# Patient Record
Sex: Female | Born: 1990 | Race: Asian | Hispanic: No | Marital: Married | Smoking: Never smoker
Health system: Southern US, Community
[De-identification: ages and names within clinical notes are randomized; demographics above are authoritative.]

## PROBLEM LIST (undated history)

## (undated) DIAGNOSIS — Z789 Other specified health status: Secondary | ICD-10-CM

---

## 2013-12-23 ENCOUNTER — Other Ambulatory Visit (HOSPITAL_COMMUNITY)
Admission: RE | Admit: 2013-12-23 | Discharge: 2013-12-23 | Disposition: A | Discharge status: Home / Self Care | Payer: 59 | Source: Ambulatory Visit | Attending: Family Medicine | Admitting: Family Medicine

## 2013-12-23 DIAGNOSIS — Z113 Encounter for screening for infections with a predominantly sexual mode of transmission: Secondary | ICD-10-CM | POA: Insufficient documentation

## 2013-12-23 DIAGNOSIS — Z124 Encounter for screening for malignant neoplasm of cervix: Secondary | ICD-10-CM | POA: Insufficient documentation

## 2014-11-03 LAB — OB RESULTS CONSOLE RUBELLA ANTIBODY, IGM: RUBELLA: IMMUNE

## 2014-11-03 LAB — OB RESULTS CONSOLE GC/CHLAMYDIA
Chlamydia: NEGATIVE
GC PROBE AMP, GENITAL: NEGATIVE

## 2014-11-03 LAB — OB RESULTS CONSOLE GBS: STREP GROUP B AG: NEGATIVE

## 2014-11-03 LAB — OB RESULTS CONSOLE RPR: RPR: NONREACTIVE

## 2014-11-03 LAB — OB RESULTS CONSOLE HEPATITIS B SURFACE ANTIGEN: Hepatitis B Surface Ag: NEGATIVE

## 2014-11-03 LAB — OB RESULTS CONSOLE HIV ANTIBODY (ROUTINE TESTING): HIV: NONREACTIVE

## 2014-11-03 LAB — OB RESULTS CONSOLE ABO/RH: RH Type: POSITIVE

## 2014-12-03 ENCOUNTER — Inpatient Hospital Stay (HOSPITAL_COMMUNITY): Admission: AD | Admit: 2014-12-03 | Payer: Self-pay | Source: Ambulatory Visit | Admitting: Obstetrics and Gynecology

## 2014-12-29 ENCOUNTER — Other Ambulatory Visit (HOSPITAL_COMMUNITY): Payer: Self-pay | Admitting: Obstetrics and Gynecology

## 2014-12-29 DIAGNOSIS — O280 Abnormal hematological finding on antenatal screening of mother: Secondary | ICD-10-CM

## 2015-01-07 ENCOUNTER — Other Ambulatory Visit (HOSPITAL_COMMUNITY): Payer: Self-pay | Admitting: Obstetrics and Gynecology

## 2015-01-07 ENCOUNTER — Ambulatory Visit (HOSPITAL_COMMUNITY)
Admission: RE | Admit: 2015-01-07 | Discharge: 2015-01-07 | Disposition: A | Discharge status: Home / Self Care | Payer: BLUE CROSS/BLUE SHIELD | Source: Ambulatory Visit | Attending: Obstetrics and Gynecology | Admitting: Obstetrics and Gynecology

## 2015-01-07 ENCOUNTER — Encounter (HOSPITAL_COMMUNITY): Payer: Self-pay

## 2015-01-07 ENCOUNTER — Ambulatory Visit (HOSPITAL_COMMUNITY): Admission: RE | Admit: 2015-01-07 | Payer: BLUE CROSS/BLUE SHIELD | Source: Ambulatory Visit

## 2015-01-07 DIAGNOSIS — O289 Unspecified abnormal findings on antenatal screening of mother: Secondary | ICD-10-CM | POA: Diagnosis not present

## 2015-01-07 DIAGNOSIS — Z3A16 16 weeks gestation of pregnancy: Secondary | ICD-10-CM | POA: Insufficient documentation

## 2015-01-07 DIAGNOSIS — Z3689 Encounter for other specified antenatal screening: Secondary | ICD-10-CM | POA: Insufficient documentation

## 2015-01-07 DIAGNOSIS — O280 Abnormal hematological finding on antenatal screening of mother: Secondary | ICD-10-CM

## 2015-01-07 HISTORY — DX: Other specified health status: Z78.9

## 2015-02-03 ENCOUNTER — Encounter (HOSPITAL_COMMUNITY): Payer: Self-pay

## 2015-02-03 ENCOUNTER — Ambulatory Visit (HOSPITAL_COMMUNITY)
Admission: RE | Admit: 2015-02-03 | Discharge: 2015-02-03 | Disposition: A | Discharge status: Home / Self Care | Payer: BLUE CROSS/BLUE SHIELD | Source: Ambulatory Visit | Attending: Obstetrics and Gynecology | Admitting: Obstetrics and Gynecology

## 2015-02-03 DIAGNOSIS — O280 Abnormal hematological finding on antenatal screening of mother: Secondary | ICD-10-CM

## 2015-02-03 DIAGNOSIS — Z3A20 20 weeks gestation of pregnancy: Secondary | ICD-10-CM | POA: Insufficient documentation

## 2015-02-03 DIAGNOSIS — O4402 Placenta previa specified as without hemorrhage, second trimester: Secondary | ICD-10-CM | POA: Diagnosis not present

## 2015-02-03 DIAGNOSIS — IMO0002 Reserved for concepts with insufficient information to code with codable children: Secondary | ICD-10-CM | POA: Insufficient documentation

## 2015-02-03 DIAGNOSIS — Z36 Encounter for antenatal screening of mother: Secondary | ICD-10-CM | POA: Insufficient documentation

## 2015-02-03 DIAGNOSIS — Z0489 Encounter for examination and observation for other specified reasons: Secondary | ICD-10-CM | POA: Insufficient documentation

## 2015-05-27 LAB — OB RESULTS CONSOLE GBS: GBS: NEGATIVE

## 2015-06-24 ENCOUNTER — Telehealth (HOSPITAL_COMMUNITY): Payer: Self-pay | Admitting: *Deleted

## 2015-06-24 NOTE — Telephone Encounter (Signed)
Preadmission screen  

## 2015-06-27 ENCOUNTER — Encounter (HOSPITAL_COMMUNITY): Payer: Self-pay | Admitting: *Deleted

## 2015-06-29 ENCOUNTER — Inpatient Hospital Stay (HOSPITAL_COMMUNITY)
Admission: AD | Admit: 2015-06-29 | Discharge: 2015-07-03 | DRG: 765 | Disposition: A | Discharge status: Home / Self Care | Payer: BLUE CROSS/BLUE SHIELD | Source: Ambulatory Visit | Attending: Obstetrics and Gynecology | Admitting: Obstetrics and Gynecology

## 2015-06-29 ENCOUNTER — Other Ambulatory Visit: Payer: Self-pay | Admitting: Obstetrics and Gynecology

## 2015-06-29 ENCOUNTER — Encounter (HOSPITAL_COMMUNITY): Payer: Self-pay | Admitting: *Deleted

## 2015-06-29 DIAGNOSIS — O480 Post-term pregnancy: Secondary | ICD-10-CM | POA: Diagnosis present

## 2015-06-29 DIAGNOSIS — O134 Gestational [pregnancy-induced] hypertension without significant proteinuria, complicating childbirth: Secondary | ICD-10-CM | POA: Diagnosis present

## 2015-06-29 DIAGNOSIS — Z3A41 41 weeks gestation of pregnancy: Secondary | ICD-10-CM

## 2015-06-29 DIAGNOSIS — Z880 Allergy status to penicillin: Secondary | ICD-10-CM

## 2015-06-29 DIAGNOSIS — O411230 Chorioamnionitis, third trimester, not applicable or unspecified: Secondary | ICD-10-CM | POA: Diagnosis not present

## 2015-06-29 DIAGNOSIS — O411290 Chorioamnionitis, unspecified trimester, not applicable or unspecified: Secondary | ICD-10-CM | POA: Diagnosis present

## 2015-06-29 LAB — CBC
HCT: 37.7 % (ref 36.0–46.0)
Hemoglobin: 13 g/dL (ref 12.0–15.0)
MCH: 31.2 pg (ref 26.0–34.0)
MCHC: 34.5 g/dL (ref 30.0–36.0)
MCV: 90.4 fL (ref 78.0–100.0)
PLATELETS: 228 10*3/uL (ref 150–400)
RBC: 4.17 MIL/uL (ref 3.87–5.11)
RDW: 13.3 % (ref 11.5–15.5)
WBC: 10.3 10*3/uL (ref 4.0–10.5)

## 2015-06-29 MED ORDER — MISOPROSTOL 25 MCG QUARTER TABLET
25.0000 ug | ORAL_TABLET | ORAL | Status: DC | PRN
  Administered 2015-06-29: 25 ug via VAGINAL
  Filled 2015-06-29: qty 0.25

## 2015-06-29 MED ORDER — TERBUTALINE SULFATE 1 MG/ML IJ SOLN: 0.2500 mg | Freq: Once | INTRAMUSCULAR | Status: DC | PRN

## 2015-06-29 MED ORDER — CITRIC ACID-SODIUM CITRATE 334-500 MG/5ML PO SOLN
30.0000 mL | ORAL | Status: DC | PRN
  Administered 2015-06-30: 30 mL via ORAL
  Filled 2015-06-29: qty 15

## 2015-06-29 MED ORDER — OXYTOCIN 40 UNITS IN LACTATED RINGERS INFUSION - SIMPLE MED: 62.5000 mL/h | INTRAVENOUS | Status: DC

## 2015-06-29 MED ORDER — FENTANYL CITRATE (PF) 100 MCG/2ML IJ SOLN
50.0000 ug | INTRAMUSCULAR | Status: DC | PRN
  Administered 2015-06-30: 50 ug via INTRAVENOUS
  Filled 2015-06-29: qty 2

## 2015-06-29 MED ORDER — LIDOCAINE HCL (PF) 1 % IJ SOLN: 30.0000 mL | INTRAMUSCULAR | Status: DC | PRN

## 2015-06-29 MED ORDER — ACETAMINOPHEN 325 MG PO TABS: 650.0000 mg | ORAL_TABLET | ORAL | Status: DC | PRN

## 2015-06-29 MED ORDER — OXYCODONE-ACETAMINOPHEN 5-325 MG PO TABS: 1.0000 | ORAL_TABLET | ORAL | Status: DC | PRN

## 2015-06-29 MED ORDER — LACTATED RINGERS IV SOLN
500.0000 mL | INTRAVENOUS | Status: DC | PRN
  Administered 2015-06-30 (×2): 1000 mL via INTRAVENOUS
  Administered 2015-06-30 (×3): 500 mL via INTRAVENOUS
  Administered 2015-06-30: 1000 mL via INTRAVENOUS

## 2015-06-29 MED ORDER — OXYTOCIN BOLUS FROM INFUSION: 500.0000 mL | INTRAVENOUS | Status: DC

## 2015-06-29 MED ORDER — ONDANSETRON HCL 4 MG/2ML IJ SOLN
4.0000 mg | Freq: Four times a day (QID) | INTRAMUSCULAR | Status: DC | PRN
Start: 2015-06-29 — End: 2015-06-30

## 2015-06-29 MED ORDER — LACTATED RINGERS IV SOLN
INTRAVENOUS | Status: DC
  Administered 2015-06-29: 125 mL/h via INTRAVENOUS
  Administered 2015-06-30: 09:00:00 via INTRAVENOUS
  Administered 2015-06-30: 125 mL/h via INTRAVENOUS
  Administered 2015-06-30: 13:00:00 via INTRAVENOUS

## 2015-06-29 MED ORDER — OXYCODONE-ACETAMINOPHEN 5-325 MG PO TABS: 2.0000 | ORAL_TABLET | ORAL | Status: DC | PRN

## 2015-06-29 NOTE — MAU Note (Signed)
Pt states contractions started around 1900 and now are every 3-4 minutes.  Pt has induction scheduled for midnight but was having too much pain to wait.  Good fetal movement.  Denies vaginal bleeding but some pinkish discharge.  No ROM.

## 2015-06-29 NOTE — H&P (Signed)
Jennifer Brooks is a 24 y.o. female  G2 P0010 at 41 wks and 0 days based on 16 wk ultrasound with EDD 06/23/2015 presents for induction. Her pregnancy was complicated by abnormal tetra screen ( 1 out 10 risk of down syndrome) Panorama test was negative with low risk result less than 1 out 10,0000 risk of down syndrome.    History OB History    Gravida Para Term Preterm AB TAB SAB Ectopic Multiple Living   2    1 1     0     Past Medical History  Diagnosis Date  . Medical history non-contributory    Past Surgical History  Procedure Laterality Date  . No past surgeries     Family History: family history includes Hypertension in her maternal grandfather, maternal grandmother, and mother; Miscarriages / [place] in her sister. There is no history of Alcohol abuse, Arthritis, Asthma, Birth defects, Cancer, COPD, Depression, Diabetes, Drug abuse, Early death, Hearing loss, Heart disease, Hyperlipidemia, Kidney disease, Learning disabilities, Mental illness, Mental retardation, Stroke, Vision loss, or Varicose Veins. Social History:  reports that she has never smoked. She has never used smokeless tobacco. She reports that she does not drink alcohol or use illicit drugs.   Prenatal Transfer Tool  Maternal Diabetes: No Genetic Screening: Abnormal:  Results: Other: Tetra was abnormal for elevated risk of trisomy 1 out 10.Marland Kitchen. Panorama result was low risk less than 1 out of [id number] Maternal Ultrasounds/Referrals: Normal Fetal Ultrasounds or other Referrals:  None Maternal Substance Abuse:  No Significant Maternal Medications:  None Significant Maternal Lab Results:  Lab values include: Group B Strep negative Other Comments:  None  Review of Systems  Constitutional: Negative.   HENT: Negative.   Eyes: Negative.   Respiratory: Negative.   Cardiovascular: Negative.   Gastrointestinal: Negative.   Genitourinary: Negative.   Musculoskeletal: Negative.   Skin: Negative.   Neurological: Negative.    Endo/Heme/Allergies: Negative.   Psychiatric/Behavioral: Negative.       Last menstrual period 08/31/2014. Maternal Exam:  Abdomen: Patient reports no abdominal tenderness. Estimated fetal weight is 8 lbs .   Fetal presentation: vertex  Introitus: Normal vulva. Normal vagina.  Pelvis: adequate for delivery.   Cervix: Cervix evaluated by digital exam.     Physical Exam  Vitals reviewed. Constitutional: She is oriented to person, place, and time. She appears well-developed and well-nourished.  HENT:  Head: Normocephalic and atraumatic.  Eyes: Conjunctivae are normal. Pupils are equal, round, and reactive to light.  Neck: Normal range of motion. Neck supple.  Cardiovascular: Normal rate and regular rhythm.   Respiratory: Effort normal and breath sounds normal.  GI: Bowel sounds are normal. There is no tenderness.  Genitourinary: Vagina normal.  Musculoskeletal: Normal range of motion.  Neurological: She is alert and oriented to person, place, and time. She has normal reflexes.  Skin: Skin is warm and dry.  Psychiatric: She has a normal mood and affect.   Cervix Closed Soft mid position    Prenatal labs: ABO, Rh: B/Positive/-- (03/23 0000) Antibody:   Rubella: Immune (03/23 0000) RPR: Nonreactive (03/23 0000)  HBsAg:   Negative  HIV: Non-reactive (03/23 0000)  GBS: Negative (10/14 0000)   Assessment/Plan: 41 wks and 0 days for post dates induction.  She is informed of increased r/o cesarean section associated with induction and accepts this risk Plan cytotec for cervical ripening.  Anticipate SVD Dr. Myna HidalgoJennifer Ozan covering from midnight to 7 am on 06/30/2015  Lorraine Terriquez J. 06/29/2015, 5:56 PM

## 2015-06-30 ENCOUNTER — Inpatient Hospital Stay (HOSPITAL_COMMUNITY): Payer: BLUE CROSS/BLUE SHIELD | Admitting: Anesthesiology

## 2015-06-30 ENCOUNTER — Inpatient Hospital Stay (HOSPITAL_COMMUNITY): Admission: RE | Admit: 2015-06-30 | Payer: BLUE CROSS/BLUE SHIELD | Source: Ambulatory Visit

## 2015-06-30 ENCOUNTER — Encounter (HOSPITAL_COMMUNITY)
Admission: AD | Disposition: A | Discharge status: Home / Self Care | Payer: Self-pay | Source: Ambulatory Visit | Attending: Obstetrics and Gynecology

## 2015-06-30 ENCOUNTER — Encounter (HOSPITAL_COMMUNITY): Payer: Self-pay | Admitting: Anesthesiology

## 2015-06-30 DIAGNOSIS — O411290 Chorioamnionitis, unspecified trimester, not applicable or unspecified: Secondary | ICD-10-CM | POA: Diagnosis present

## 2015-06-30 LAB — PROTEIN / CREATININE RATIO, URINE
Creatinine, Urine: 99 mg/dL
PROTEIN CREATININE RATIO: 0.09 mg/mg{creat} (ref 0.00–0.15)
Total Protein, Urine: 9 mg/dL

## 2015-06-30 LAB — COMPREHENSIVE METABOLIC PANEL
ALBUMIN: 3.3 g/dL — AB (ref 3.5–5.0)
ALT: 32 U/L (ref 14–54)
ANION GAP: 9 (ref 5–15)
AST: 32 U/L (ref 15–41)
Alkaline Phosphatase: 271 U/L — ABNORMAL HIGH (ref 38–126)
BILIRUBIN TOTAL: 0.4 mg/dL (ref 0.3–1.2)
BUN: 12 mg/dL (ref 6–20)
CHLORIDE: 105 mmol/L (ref 101–111)
CO2: 21 mmol/L — AB (ref 22–32)
Calcium: 9.4 mg/dL (ref 8.9–10.3)
Creatinine, Ser: 0.55 mg/dL (ref 0.44–1.00)
GFR calc Af Amer: >60 mL/min (ref >60)
GFR calc non Af Amer: >60 mL/min (ref >60)
GLUCOSE: 99 mg/dL (ref 65–99)
POTASSIUM: 3.8 mmol/L (ref 3.5–5.1)
SODIUM: 135 mmol/L (ref 135–145)
Total Protein: 6.4 g/dL — ABNORMAL LOW (ref 6.5–8.1)

## 2015-06-30 LAB — RPR: RPR: NONREACTIVE

## 2015-06-30 LAB — TYPE AND SCREEN
ABO/RH(D): B POS
Antibody Screen: NEGATIVE

## 2015-06-30 LAB — URIC ACID: Uric Acid, Serum: 6.6 mg/dL (ref 2.3–6.6)

## 2015-06-30 LAB — ABO/RH: ABO/RH(D): B POS

## 2015-06-30 SURGERY — Surgical Case
Anesthesia: Epidural

## 2015-06-30 MED ORDER — VANCOMYCIN HCL IN DEXTROSE 1-5 GM/200ML-% IV SOLN: 1000.0000 mg | Freq: Two times a day (BID) | INTRAVENOUS | Status: DC

## 2015-06-30 MED ORDER — CLINDAMYCIN PHOSPHATE 900 MG/50ML IV SOLN: 900.0000 mg | Freq: Three times a day (TID) | INTRAVENOUS | Status: DC

## 2015-06-30 MED ORDER — ZOLPIDEM TARTRATE 5 MG PO TABS: 5.0000 mg | ORAL_TABLET | Freq: Every evening | ORAL | Status: DC | PRN

## 2015-06-30 MED ORDER — OXYTOCIN 40 UNITS IN LACTATED RINGERS INFUSION - SIMPLE MED
1.0000 m[IU]/min | INTRAVENOUS | Status: DC
  Administered 2015-06-30: 3 m[IU]/min via INTRAVENOUS
  Administered 2015-06-30: 1 m[IU]/min via INTRAVENOUS
  Filled 2015-06-30: qty 1000

## 2015-06-30 MED ORDER — PHENYLEPHRINE 40 MCG/ML (10ML) SYRINGE FOR IV PUSH (FOR BLOOD PRESSURE SUPPORT)
PREFILLED_SYRINGE | INTRAVENOUS | Status: AC
  Filled 2015-06-30: qty 10

## 2015-06-30 MED ORDER — ONDANSETRON HCL 4 MG/2ML IJ SOLN: 4.0000 mg | Freq: Three times a day (TID) | INTRAMUSCULAR | Status: DC | PRN

## 2015-06-30 MED ORDER — SODIUM BICARBONATE 8.4 % IV SOLN
INTRAVENOUS | Status: DC | PRN
  Administered 2015-06-30: 5 mL via EPIDURAL
  Administered 2015-06-30: 2 mL via EPIDURAL
  Administered 2015-06-30: 3 mL via EPIDURAL
  Administered 2015-06-30: 5 mL via EPIDURAL

## 2015-06-30 MED ORDER — DIPHENHYDRAMINE HCL 50 MG/ML IJ SOLN: 12.5000 mg | INTRAMUSCULAR | Status: DC | PRN

## 2015-06-30 MED ORDER — PHENYLEPHRINE HCL 10 MG/ML IJ SOLN
INTRAMUSCULAR | Status: DC | PRN
  Administered 2015-06-30: 40 ug via INTRAVENOUS

## 2015-06-30 MED ORDER — KETOROLAC TROMETHAMINE 30 MG/ML IJ SOLN: 30.0000 mg | Freq: Four times a day (QID) | INTRAMUSCULAR | Status: AC | PRN

## 2015-06-30 MED ORDER — FENTANYL 2.5 MCG/ML BUPIVACAINE 1/10 % EPIDURAL INFUSION (WH - ANES)
14.0000 mL/h | INTRAMUSCULAR | Status: DC | PRN
  Administered 2015-06-30 (×3): 14 mL/h via EPIDURAL
  Filled 2015-06-30 (×2): qty 125

## 2015-06-30 MED ORDER — MORPHINE SULFATE (PF) 0.5 MG/ML IJ SOLN
INTRAMUSCULAR | Status: DC | PRN
  Administered 2015-06-30: 1 mg via INTRAVENOUS
  Administered 2015-06-30: 4 mg via EPIDURAL

## 2015-06-30 MED ORDER — NALBUPHINE HCL 10 MG/ML IJ SOLN: 5.0000 mg | INTRAMUSCULAR | Status: DC | PRN

## 2015-06-30 MED ORDER — OXYCODONE-ACETAMINOPHEN 5-325 MG PO TABS
1.0000 | ORAL_TABLET | ORAL | Status: DC | PRN
  Administered 2015-07-01 – 2015-07-02 (×4): 1 via ORAL
  Filled 2015-06-30 (×4): qty 1

## 2015-06-30 MED ORDER — LACTATED RINGERS IV SOLN
INTRAVENOUS | Status: DC
  Administered 2015-06-30 – 2015-07-01 (×3): via INTRAVENOUS

## 2015-06-30 MED ORDER — LACTATED RINGERS IV SOLN
INTRAVENOUS | Status: DC | PRN
  Administered 2015-06-30 (×2): via INTRAVENOUS

## 2015-06-30 MED ORDER — PRENATAL MULTIVITAMIN CH
1.0000 | ORAL_TABLET | Freq: Every day | ORAL | Status: DC
  Administered 2015-07-01 – 2015-07-03 (×3): 1 via ORAL
  Filled 2015-06-30 (×3): qty 1

## 2015-06-30 MED ORDER — ONDANSETRON HCL 4 MG/2ML IJ SOLN: 4.0000 mg | Freq: Once | INTRAMUSCULAR | Status: DC | PRN

## 2015-06-30 MED ORDER — OXYCODONE-ACETAMINOPHEN 5-325 MG PO TABS
2.0000 | ORAL_TABLET | ORAL | Status: DC | PRN
  Administered 2015-07-02 – 2015-07-03 (×4): 2 via ORAL
  Filled 2015-06-30 (×4): qty 2

## 2015-06-30 MED ORDER — SENNOSIDES-DOCUSATE SODIUM 8.6-50 MG PO TABS
2.0000 | ORAL_TABLET | ORAL | Status: DC
  Administered 2015-06-30 – 2015-07-02 (×2): 2 via ORAL
  Filled 2015-06-30 (×2): qty 2

## 2015-06-30 MED ORDER — FENTANYL CITRATE (PF) 100 MCG/2ML IJ SOLN: 25.0000 ug | INTRAMUSCULAR | Status: DC | PRN

## 2015-06-30 MED ORDER — ONDANSETRON HCL 4 MG/2ML IJ SOLN
INTRAMUSCULAR | Status: AC
  Filled 2015-06-30: qty 2

## 2015-06-30 MED ORDER — EPHEDRINE 5 MG/ML INJ: 10.0000 mg | INTRAVENOUS | Status: DC | PRN

## 2015-06-30 MED ORDER — OXYTOCIN 10 UNIT/ML IJ SOLN
40.0000 [IU] | INTRAVENOUS | Status: DC | PRN
  Administered 2015-06-30: 40 [IU] via INTRAVENOUS

## 2015-06-30 MED ORDER — MORPHINE SULFATE (PF) 0.5 MG/ML IJ SOLN
INTRAMUSCULAR | Status: AC
  Filled 2015-06-30: qty 10

## 2015-06-30 MED ORDER — NALBUPHINE HCL 10 MG/ML IJ SOLN: 5.0000 mg | Freq: Once | INTRAMUSCULAR | Status: DC | PRN

## 2015-06-30 MED ORDER — WITCH HAZEL-GLYCERIN EX PADS: 1.0000 "application " | MEDICATED_PAD | CUTANEOUS | Status: DC | PRN

## 2015-06-30 MED ORDER — LANOLIN HYDROUS EX OINT: 1.0000 "application " | TOPICAL_OINTMENT | CUTANEOUS | Status: DC | PRN

## 2015-06-30 MED ORDER — ACETAMINOPHEN 325 MG PO TABS: 650.0000 mg | ORAL_TABLET | ORAL | Status: DC | PRN

## 2015-06-30 MED ORDER — SCOPOLAMINE 1 MG/3DAYS TD PT72
MEDICATED_PATCH | TRANSDERMAL | Status: AC
  Filled 2015-06-30: qty 1

## 2015-06-30 MED ORDER — SCOPOLAMINE 1 MG/3DAYS TD PT72
1.0000 | MEDICATED_PATCH | Freq: Once | TRANSDERMAL | Status: DC
  Filled 2015-06-30: qty 1

## 2015-06-30 MED ORDER — SIMETHICONE 80 MG PO CHEW
80.0000 mg | CHEWABLE_TABLET | Freq: Three times a day (TID) | ORAL | Status: DC
  Administered 2015-07-01 – 2015-07-03 (×8): 80 mg via ORAL
  Filled 2015-06-30 (×8): qty 1

## 2015-06-30 MED ORDER — ACETAMINOPHEN 500 MG PO TABS
1000.0000 mg | ORAL_TABLET | Freq: Four times a day (QID) | ORAL | Status: AC
  Administered 2015-06-30 – 2015-07-01 (×3): 1000 mg via ORAL
  Filled 2015-06-30 (×3): qty 2

## 2015-06-30 MED ORDER — GENTAMICIN SULFATE 40 MG/ML IJ SOLN
Freq: Three times a day (TID) | INTRAVENOUS | Status: DC
  Administered 2015-06-30: 17:00:00 via INTRAVENOUS
  Filled 2015-06-30 (×2): qty 4

## 2015-06-30 MED ORDER — DIPHENHYDRAMINE HCL 25 MG PO CAPS: 25.0000 mg | ORAL_CAPSULE | Freq: Four times a day (QID) | ORAL | Status: DC | PRN

## 2015-06-30 MED ORDER — TERBUTALINE SULFATE 1 MG/ML IJ SOLN: 0.2500 mg | Freq: Once | INTRAMUSCULAR | Status: DC | PRN

## 2015-06-30 MED ORDER — MEPERIDINE HCL 25 MG/ML IJ SOLN: 6.2500 mg | INTRAMUSCULAR | Status: DC | PRN

## 2015-06-30 MED ORDER — ERYTHROMYCIN 5 MG/GM OP OINT
TOPICAL_OINTMENT | OPHTHALMIC | Status: AC
  Filled 2015-06-30: qty 1

## 2015-06-30 MED ORDER — LACTATED RINGERS IV SOLN: INTRAVENOUS | Status: DC

## 2015-06-30 MED ORDER — NALOXONE HCL 0.4 MG/ML IJ SOLN: 0.4000 mg | INTRAMUSCULAR | Status: DC | PRN

## 2015-06-30 MED ORDER — DIBUCAINE 1 % RE OINT: 1.0000 "application " | TOPICAL_OINTMENT | RECTAL | Status: DC | PRN

## 2015-06-30 MED ORDER — LIDOCAINE HCL (PF) 1 % IJ SOLN
INTRAMUSCULAR | Status: DC | PRN
  Administered 2015-06-30 (×2): 8 mL via EPIDURAL

## 2015-06-30 MED ORDER — OXYTOCIN 10 UNIT/ML IJ SOLN
INTRAMUSCULAR | Status: AC
Start: 2015-06-30 — End: 2015-06-30
  Filled 2015-06-30: qty 4

## 2015-06-30 MED ORDER — SODIUM CHLORIDE 0.9 % IR SOLN
Status: DC | PRN
  Administered 2015-06-30: 1000 mL

## 2015-06-30 MED ORDER — ACETAMINOPHEN 500 MG PO TABS
1000.0000 mg | ORAL_TABLET | Freq: Four times a day (QID) | ORAL | Status: DC | PRN
  Administered 2015-06-30: 1000 mg via ORAL
  Filled 2015-06-30: qty 2

## 2015-06-30 MED ORDER — LACTATED RINGERS IV SOLN
INTRAVENOUS | Status: DC | PRN
  Administered 2015-06-30: 19:00:00 via INTRAVENOUS

## 2015-06-30 MED ORDER — IBUPROFEN 600 MG PO TABS
600.0000 mg | ORAL_TABLET | Freq: Four times a day (QID) | ORAL | Status: DC
  Administered 2015-07-01 – 2015-07-03 (×10): 600 mg via ORAL
  Filled 2015-06-30 (×10): qty 1

## 2015-06-30 MED ORDER — NALOXONE HCL 2 MG/2ML IJ SOSY: 1.0000 ug/kg/h | PREFILLED_SYRINGE | INTRAMUSCULAR | Status: DC | PRN

## 2015-06-30 MED ORDER — PHENYLEPHRINE 40 MCG/ML (10ML) SYRINGE FOR IV PUSH (FOR BLOOD PRESSURE SUPPORT)
80.0000 ug | PREFILLED_SYRINGE | INTRAVENOUS | Status: DC | PRN
  Filled 2015-06-30: qty 20

## 2015-06-30 MED ORDER — MENTHOL 3 MG MT LOZG: 1.0000 | LOZENGE | OROMUCOSAL | Status: DC | PRN

## 2015-06-30 MED ORDER — SIMETHICONE 80 MG PO CHEW: 80.0000 mg | CHEWABLE_TABLET | ORAL | Status: DC | PRN

## 2015-06-30 MED ORDER — DIPHENHYDRAMINE HCL 25 MG PO CAPS: 25.0000 mg | ORAL_CAPSULE | ORAL | Status: DC | PRN

## 2015-06-30 MED ORDER — OXYTOCIN 40 UNITS IN LACTATED RINGERS INFUSION - SIMPLE MED: 62.5000 mL/h | INTRAVENOUS | Status: AC

## 2015-06-30 MED ORDER — SIMETHICONE 80 MG PO CHEW
80.0000 mg | CHEWABLE_TABLET | ORAL | Status: DC
  Administered 2015-06-30 – 2015-07-02 (×2): 80 mg via ORAL
  Filled 2015-06-30 (×2): qty 1

## 2015-06-30 MED ORDER — FERROUS SULFATE 325 (65 FE) MG PO TABS
325.0000 mg | ORAL_TABLET | Freq: Two times a day (BID) | ORAL | Status: DC
Start: 2015-07-01 — End: 2015-07-03
  Administered 2015-07-01 – 2015-07-03 (×5): 325 mg via ORAL
  Filled 2015-06-30 (×5): qty 1

## 2015-06-30 MED ORDER — KETOROLAC TROMETHAMINE 30 MG/ML IJ SOLN
30.0000 mg | Freq: Four times a day (QID) | INTRAMUSCULAR | Status: AC | PRN
  Administered 2015-06-30: 30 mg via INTRAMUSCULAR

## 2015-06-30 MED ORDER — SCOPOLAMINE 1 MG/3DAYS TD PT72
MEDICATED_PATCH | TRANSDERMAL | Status: DC | PRN
  Administered 2015-06-30: 1 via TRANSDERMAL

## 2015-06-30 MED ORDER — SODIUM CHLORIDE 0.9 % IJ SOLN: 3.0000 mL | INTRAMUSCULAR | Status: DC | PRN

## 2015-06-30 MED ORDER — KETOROLAC TROMETHAMINE 30 MG/ML IJ SOLN
INTRAMUSCULAR | Status: AC
  Administered 2015-06-30: 30 mg via INTRAMUSCULAR
  Filled 2015-06-30: qty 1

## 2015-06-30 MED ORDER — ONDANSETRON HCL 4 MG/2ML IJ SOLN
INTRAMUSCULAR | Status: DC | PRN
  Administered 2015-06-30: 4 mg via INTRAVENOUS

## 2015-06-30 SURGICAL SUPPLY — 40 items
BARRIER ADHS 3X4 INTERCEED (GAUZE/BANDAGES/DRESSINGS) ×2 IMPLANT
BENZOIN TINCTURE PRP APPL 2/3 (GAUZE/BANDAGES/DRESSINGS) ×2 IMPLANT
CLAMP CORD UMBIL (MISCELLANEOUS) IMPLANT
CLOTH BEACON ORANGE TIMEOUT ST (SAFETY) ×2 IMPLANT
CONTAINER PREFILL 10% NBF 15ML (MISCELLANEOUS) IMPLANT
DRAPE SHEET LG 3/4 BI-LAMINATE (DRAPES) IMPLANT
DRSG OPSITE POSTOP 4X10 (GAUZE/BANDAGES/DRESSINGS) ×2 IMPLANT
DURAPREP 26ML APPLICATOR (WOUND CARE) ×2 IMPLANT
ELECT REM PT RETURN 9FT ADLT (ELECTROSURGICAL) ×2
ELECTRODE REM PT RTRN 9FT ADLT (ELECTROSURGICAL) ×1 IMPLANT
EXTRACTOR VACUUM M CUP 4 TUBE (SUCTIONS) IMPLANT
GLOVE BIOGEL M 6.5 STRL (GLOVE) ×4 IMPLANT
GLOVE BIOGEL PI IND STRL 6.5 (GLOVE) ×1 IMPLANT
GLOVE BIOGEL PI IND STRL 7.0 (GLOVE) ×1 IMPLANT
GLOVE BIOGEL PI INDICATOR 6.5 (GLOVE) ×1
GLOVE BIOGEL PI INDICATOR 7.0 (GLOVE) ×1
GOWN STRL REUS W/TWL LRG LVL3 (GOWN DISPOSABLE) ×6 IMPLANT
KIT ABG SYR 3ML LUER SLIP (SYRINGE) IMPLANT
NEEDLE HYPO 25X5/8 SAFETYGLIDE (NEEDLE) IMPLANT
NS IRRIG 1000ML POUR BTL (IV SOLUTION) ×2 IMPLANT
PACK C SECTION WH (CUSTOM PROCEDURE TRAY) ×2 IMPLANT
PAD OB MATERNITY 4.3X12.25 (PERSONAL CARE ITEMS) ×2 IMPLANT
PENCIL SMOKE EVAC W/HOLSTER (ELECTROSURGICAL) ×2 IMPLANT
RTRCTR C-SECT PINK 25CM LRG (MISCELLANEOUS) IMPLANT
RTRCTR C-SECT PINK 34CM XLRG (MISCELLANEOUS) IMPLANT
STRIP CLOSURE SKIN 1/2X4 (GAUZE/BANDAGES/DRESSINGS) ×2 IMPLANT
SUT PDS AB 0 CT1 27 (SUTURE) ×4 IMPLANT
SUT PLAIN 0 NONE (SUTURE) IMPLANT
SUT VIC AB 0 CT2 27 (SUTURE) ×2 IMPLANT
SUT VIC AB 0 CTX 36 (SUTURE) ×4
SUT VIC AB 0 CTX36XBRD ANBCTRL (SUTURE) ×4 IMPLANT
SUT VIC AB 2-0 CT1 27 (SUTURE) ×2
SUT VIC AB 2-0 CT1 TAPERPNT 27 (SUTURE) ×2 IMPLANT
SUT VIC AB 2-0 SH 27 (SUTURE) ×1
SUT VIC AB 2-0 SH 27XBRD (SUTURE) ×1 IMPLANT
SUT VIC AB 3-0 SH 27 (SUTURE)
SUT VIC AB 3-0 SH 27X BRD (SUTURE) IMPLANT
SUT VIC AB 4-0 KS 27 (SUTURE) ×2 IMPLANT
TOWEL OR 17X24 6PK STRL BLUE (TOWEL DISPOSABLE) ×2 IMPLANT
TRAY FOLEY CATH SILVER 14FR (SET/KITS/TRAYS/PACK) ×2 IMPLANT

## 2015-06-30 NOTE — Lactation Note (Signed)
This note was copied from the chart of Jennifer Skeet [name]. Lactation Consultation Note  Patient Name: Jennifer Brooks ZOXWR'UToday's Date: 06/30/2015 Reason for consult: Initial assessment Baby at 3 hr of life and mom reports that he has not attempt to eat yet. She stated that he has been sleeping since he was born. Discussed feeding frequency, waking a sleeping baby, voids, wt loss, breast changes, nipple care, and baby behavior. Given lactation handouts. She is aware of OP services. She will eat her dinner then try to latch baby again.    Maternal Data Has patient been taught Hand Expression?: Yes Does the patient have breastfeeding experience prior to this delivery?: No  Feeding Feeding Type:  (mom denies the baby attempting to latch )  LATCH Score/Interventions                      Lactation Tools Discussed/Used WIC Program: No   Consult Status Consult Status: Follow-up Date: 07/01/15 Follow-up type: In-patient    Jennifer Brooks 06/30/2015, 9:50 PM

## 2015-06-30 NOTE — Anesthesia Procedure Notes (Signed)
Epidural Patient location during procedure: OB Start time: 06/30/2015 4:09 AM End time: 06/30/2015 4:13 AM  Staffing Anesthesiologist: Leilani AbleHATCHETT, Romesha Scherer Performed by: anesthesiologist   Preanesthetic Checklist Completed: patient identified, surgical consent, pre-op evaluation, timeout performed, IV checked, risks and benefits discussed and monitors and equipment checked  Epidural Patient position: sitting Prep: site prepped and draped and DuraPrep Patient monitoring: continuous pulse ox and blood pressure Approach: midline Location: L3-L4 Injection technique: LOR air  Needle:  Needle type: Tuohy  Needle gauge: 17 G Needle length: 9 cm and 9 Needle insertion depth: 4 cm Catheter type: closed end flexible Catheter size: 19 Gauge Catheter at skin depth: 9 cm Test dose: negative and Other  Assessment Sensory level: T9 Events: blood not aspirated, injection not painful, no injection resistance, negative IV test and no paresthesia  Additional Notes Reason for block:procedure for pain

## 2015-06-30 NOTE — Progress Notes (Signed)
OB PN:  S: Pt resting comfortably, reports moderate contractions.  S/p IV Stadol x1.  No headache, no blurry vision  O: BP 128/78 mmHg  Pulse 91  Temp(Src) 98.6 F (37 C) (Oral)  Resp 16  Ht 5\' 3"  (1.6 m)  Wt 73.936 kg (163 lb)  BMI 28.88 kg/m2  LMP 08/31/2014  BP range: 117-152/69-93 FHT: 140bpm, moderate variablity,  accels, variable decels x3 to 90bpm x 30-50sec with spontaneous recovery Toco: q2-646min SVE: 1-2/25/-3 SSE: Cook balloon placed without difficulty, 50cc infused Ext: no edema, no calf tenderness bilaterally  Results for orders placed or performed during the hospital encounter of 06/29/15 (from the past 24 hour(s))  CBC     Status: None   Collection Time: 06/29/15 11:10 PM  Result Value Ref Range   WBC 10.3 4.0 - 10.5 K/uL   RBC 4.17 3.87 - 5.11 MIL/uL   Hemoglobin 13.0 12.0 - 15.0 g/dL   HCT 40.937.7 81.136.0 - 91.446.0 %   MCV 90.4 78.0 - 100.0 fL   MCH 31.2 26.0 - 34.0 pg   MCHC 34.5 30.0 - 36.0 g/dL   RDW 78.213.3 95.611.5 - 21.315.5 %   Platelets 228 150 - 400 K/uL  Type and screen     Status: None   Collection Time: 06/29/15 11:10 PM  Result Value Ref Range   ABO/RH(D) B POS    Antibody Screen NEG    Sample Expiration 07/02/2015   Uric acid     Status: None   Collection Time: 06/29/15 11:10 PM  Result Value Ref Range   Uric Acid, Serum 6.6 2.3 - 6.6 mg/dL  Comprehensive metabolic panel     Status: Abnormal   Collection Time: 06/29/15 11:10 PM  Result Value Ref Range   Sodium 135 135 - 145 mmol/L   Potassium 3.8 3.5 - 5.1 mmol/L   Chloride 105 101 - 111 mmol/L   CO2 21 (L) 22 - 32 mmol/L   Glucose, Bld 99 65 - 99 mg/dL   BUN 12 6 - 20 mg/dL   Creatinine, Ser 0.860.55 0.44 - 1.00 mg/dL   Calcium 9.4 8.9 - 57.810.3 mg/dL   Total Protein 6.4 (L) 6.5 - 8.1 g/dL   Albumin 3.3 (L) 3.5 - 5.0 g/dL   AST 32 15 - 41 U/L   ALT 32 14 - 54 U/L   Alkaline Phosphatase 271 (H) 38 - 126 U/L   Total Bilirubin 0.4 0.3 - 1.2 mg/dL   GFR calc non Af Amer >60 >60 mL/min   GFR calc Af Amer  >60 >60 mL/min   Anion gap 9 5 - 15    PC ratio pending  A/P: 24 y.o. G1P0 @ 7472w0d who presented for IOL due to postterm pregnancy 1. FWB: Cat. II- pt repositioned, overall FHT reassuring 2. Labor: s/p cytotec x 1, Cook balloon placed will start Pit per protocol Pain: IV pain medication as needed GBS: negative 3. Elevated BP-  Will rule out preeclampsia, PC ratio pending  Myna HidalgoJennifer Laurelyn Terrero, DO [phone] (pager) [phone] (office)

## 2015-06-30 NOTE — Op Note (Signed)
Cesarean Section Procedure Note  Indications: non-reassuring fetal status  Pre-operative Diagnosis: 41 week 0 day pregnancy.2. Chorioamnionitis   Post-operative Diagnosis: same  Surgeon: Jessee AversOLE,Kathleen Tamm J.   Assistants: None  Anesthesia: epidurl  anesthesia  ASA Class: 2   Procedure Details   The patient was seen in the Holding Room. The risks, benefits, complications, treatment options, and expected outcomes were discussed with the patient.  The patient concurred with the proposed plan, giving informed consent.  The site of surgery properly noted/marked. The patient was taken to Operating Room # 9, identified as Jennifer Brooks and the procedure verified as C-Section Delivery. A Time Out was held and the above information confirmed.  After induction of anesthesia, the patient was draped and prepped in the usual sterile manner. A Pfannenstiel incision was made and carried down through the subcutaneous tissue to the fascia. Fascial incision was made and extended transversely. The fascia was separated from the underlying rectus tissue superiorly and inferiorly. The peritoneum was identified and entered. Peritoneal incision was extended longitudinally. The utero-vesical peritoneal reflection was incised transversely and the bladder flap was bluntly freed from the lower uterine segment. A low transverse uterine incision was made. Delivered from cephalic presentation was a Female with Apgar scores of 9 at one minute and 10 at five minutes. After the umbilical cord was clamped and cut cord blood was obtained for evaluation. The placenta was removed intact and appeared normal. The uterine outline, tubes and ovaries appeared normal. The uterine incision was closed with running locked sutures of 0 vicryl . A second layer of the same suture was used to imbricate the incision . Hemostasis was observed. Lavage was carried out until clear. The fascia was then reapproximated with running sutures of 0 pds. The skin was  reapproximated with 4-0 vicryl .  Instrument, sponge, and needle counts were correct prior the abdominal closure and at the conclusion of the case.   Findings:  Female infant in the cephalic presentation with nuchal cord x2. Thick meconium . Normal fallopian tubes and ovaries   Estimated Blood Loss:  800 mL         Drains: Foley          Total IV Fluids:   2100 ml         Specimens: Placenta and Disposition:  Sent to Pathology          Implants: None         Complications:  None; patient tolerated the procedure well.         Disposition: PACU - hemodynamically stable.         Condition: stable  Attending Attestation: I performed the procedure.

## 2015-06-30 NOTE — Progress Notes (Signed)
Jennifer Brooks is a 24 y.o. G2P0010 at 638w0d by ultrasound admitted for induction of labor due to Post dates. Due date 06/23/2015.  Subjective:  Patient is comfortable with her epidural. She denies ha/ visual disturbances or ruq pain   Objective: BP 141/77 mmHg  Pulse 86  Temp(Src) 99.3 F (37.4 C) (Oral)  Resp 16  Ht 5\' 3"  (1.6 m)  Wt 73.936 kg (163 lb)  BMI 28.88 kg/m2  SpO2 100%  LMP 08/31/2014 I/O last 3 completed shifts: In: -  Out: 650 [Urine:650]    FHT:  FHR: 150 bpm, variability: moderate,  accelerations:  Present,  decelerations:  Present  occasional variable deceleration  UC:   regular, every 1-5 minutes SVE:   Dilation: 5 Effacement (%): 70 Station: -2 Exam by:: MD Richardson Doppole  Labs: Lab Results  Component Value Date   WBC 10.3 06/29/2015   HGB 13.0 06/29/2015   HCT 37.7 06/29/2015   MCV 90.4 06/29/2015   PLT 228 06/29/2015    Assessment / Plan: Induction of labor due to postterm,  progressing well on pitocin  Labor: Progressing on Pitocin, will continue to increase then AROM Preeclampsia:  labs stable and gestational hypertension  Fetal Wellbeing:  Category I Pain Control:  Epidural I/D:  n/a Anticipated MOD:  NSVD  Jennifer Brooks J. 06/30/2015, 7:35 AM

## 2015-06-30 NOTE — Progress Notes (Signed)
In to reassess patient.. Cervix still 8 cm /80/0 station. Head feels asynclitic  FHR 180's with minimal variability. No acelerations. Variable decelerations.  Continued category 2 tracing.  Recommend cesarean section given nonreassuring fetal heart rate .... Risks reviewed. Patient is in agreement to proceed with cesarean section

## 2015-06-30 NOTE — Progress Notes (Signed)
Jennifer Brooks is a 24 y.o. G2P0010 at 5842w0d by ultrasound admitted for induction of labor due to Post dates. Due date 06/23/2015.  Subjective: Patient is comfortable with her epidural.. Into assess patient due to report of late decelerations.  Objective: BP 126/73 mmHg  Pulse 98  Temp(Src) 101.3 F (38.5 C) (Axillary)  Resp 18  Ht 5\' 3"  (1.6 m)  Wt 73.936 kg (163 lb)  BMI 28.88 kg/m2  SpO2 100%  LMP 08/31/2014 I/O last 3 completed shifts: In: -  Out: 650 [Urine:650] Total I/O In: -  Out: 825 [Urine:825]  FHT:  FHR: 170 bpm, variability: minimal ,  accelerations:  Present,  decelerations:  Present early decelerations UC:   regular, every 2 minutes SVE:   8/80/0 station  Labs: Lab Results  Component Value Date   WBC 10.3 06/29/2015   HGB 13.0 06/29/2015   HCT 37.7 06/29/2015   MCV 90.4 06/29/2015   PLT 228 06/29/2015    Assessment / Plan: 41 wks and 0 days with chorioamnionitis  Tylenol 1 gram po  Pt has a penicillin allergy start gentamicin and clindamycin   Category 2 tracing.. Pitocin was discontinued. IV bolus given. Oxygen in place. Pt repositioned.  Will reevaluae in 30 minutes if tracing continues category 2 plan cesarean section.  R/b/a of cesarean section discussed with the patient including but not limited to infection . Bleeding, damage to bowel , bladder baby with the need for further surgery. R/o transfusion discussed. Pt voiced understanding. Will reevaluate in 30 minutes   Perline Awe J. 06/30/2015, 4:32 PM

## 2015-06-30 NOTE — Transfer of Care (Signed)
Immediate Anesthesia Transfer of Care Note  Patient: Jennifer Brooks  Procedure(s) Performed: Procedure(s): CESAREAN SECTION (N/A)  Patient Location: PACU  Anesthesia Type:Epidural  Level of Consciousness: awake, alert , oriented and patient cooperative  Airway & Oxygen Therapy: Patient Spontanous Breathing  Post-op Assessment: Report given to RN and Post -op Vital signs reviewed and stable  Post vital signs: Reviewed and stable  Last Vitals:  Filed Vitals:   06/30/15 1740  BP: 130/70  Pulse: 115  Temp:   Resp: 20    Complications: No apparent anesthesia complications

## 2015-06-30 NOTE — Progress Notes (Signed)
ANTIBIOTIC CONSULT NOTE - INITIAL  Pharmacy Consult for Gentamicin Indication: Chorioamnionitis   Allergies  Allergen Reactions  . Penicillins     Has patient had a PCN reaction causing immediate rash, facial/tongue/throat swelling, SOB or lightheadedness with hypotension:  No Has patient had a PCN reaction causing severe rash involving mucus membranes or skin necrosis: No  Has patient had a PCN reaction that required hospitalization No Has patient had a PCN reaction occurring within the last 10 years: No If all of the above answers are "NO", then may proceed with Cephalosporin use.    Patient Measurements: Height: 5\' 3"  (160 cm) Weight: 163 lb (73.936 kg) IBW/kg (Calculated) : 52.4 Adjusted Body Weight: 58.9kg  Vital Signs: Temp: 101.3 F (38.5 C) (11/17 1625) Temp Source: Axillary (11/17 1625) BP: 133/78 mmHg (11/17 1630) Pulse Rate: 91 (11/17 1630)  Labs:  Recent Labs  06/29/15 2310 06/30/15 0108  WBC 10.3  --   HGB 13.0  --   PLT 228  --   LABCREA  --  99.00  CREATININE 0.55  --    No results for input(s): GENTTROUGH, GENTPEAK, GENTRANDOM in the last 72 hours.   Microbiology: No results found for this or any previous visit (from the past 720 hour(s)).  Medications:  Clindamycin 900mg  IV q8h.  Assessment: 24 y.o. female G2P0010 at 4966w0d admitted for IOL for postdate. Pt has now developed maternal temp during labor and delivery. Estimated Ke = 0.38 , Vd = 0.4 L/kg  Goal of Therapy:  Gentamicin peak 6-8 mg/L and Trough < 1 mg/L  Plan:  Gentamicin 160 mg IV every 8 hrs combined with Clindamycin 900mg  IV q8h. Will check gentamicin levels if continued > 72hr or clinically indicated.  Claybon Jabsngel, Romanita Fager G 06/30/2015,4:53 PM

## 2015-06-30 NOTE — Anesthesia Preprocedure Evaluation (Signed)

## 2015-07-01 ENCOUNTER — Encounter (HOSPITAL_COMMUNITY): Payer: Self-pay | Admitting: Obstetrics and Gynecology

## 2015-07-01 LAB — CBC
HCT: 29.6 % — ABNORMAL LOW (ref 36.0–46.0)
Hemoglobin: 10.1 g/dL — ABNORMAL LOW (ref 12.0–15.0)
MCH: 31.3 pg (ref 26.0–34.0)
MCHC: 34.1 g/dL (ref 30.0–36.0)
MCV: 91.6 fL (ref 78.0–100.0)
PLATELETS: 181 10*3/uL (ref 150–400)
RBC: 3.23 MIL/uL — AB (ref 3.87–5.11)
RDW: 13.5 % (ref 11.5–15.5)
WBC: 15.9 10*3/uL — ABNORMAL HIGH (ref 4.0–10.5)

## 2015-07-01 MED ORDER — LACTATED RINGERS IV BOLUS (SEPSIS)
500.0000 mL | Freq: Once | INTRAVENOUS | Status: AC
  Administered 2015-07-01: 500 mL via INTRAVENOUS

## 2015-07-01 MED ORDER — FERROUS SULFATE 325 (65 FE) MG PO TABS: 325.0000 mg | ORAL_TABLET | Freq: Every day | ORAL | Status: DC

## 2015-07-01 NOTE — Lactation Note (Signed)
This note was copied from the chart of Jennifer Brooks Eslinger. Lactation Consultation Note New mom has great everted nipples. Small breast feels heavy, hand massaged breast and hand expression taught. Expressed 4ml colostrum d/t baby wouldn't BF. Mom will purchase DEBP prior to discharged home. Baby spitty of clear mucous, has no interest BF or even suckling on gloved finger. Suck training and stimulating to suckle to give give colostrum w/curve tip syring, baby kept tongue arched in back. Has high palate.  Discussed postioning during BF and latching. Mom encouraged to feed baby 8-12 times/24 hours and with feeding cues. Mom encouraged to waken baby for feeds. Mom encouraged to do skin-to-skin. Educated about newborn behavior, I&O, cluster feeding, feeding cues, supply and demand. WH/LC brochure given w/resources, support groups and LC services. Patient Name: Jennifer Brooks Leaming ZOXWR'UToday's Date: 07/01/2015 Reason for consult: Follow-up assessment   Maternal Data    Feeding Feeding Type: Breast Milk Length of feed: 0 min  LATCH Score/Interventions Latch: Too sleepy or reluctant, no latch achieved, no sucking elicited. Intervention(s): Skin to skin;Teach feeding cues;Waking techniques  Audible Swallowing: None Intervention(s): Skin to skin;Hand expression  Type of Nipple: Everted at rest and after stimulation  Comfort (Breast/Nipple): Soft / non-tender     Hold (Positioning): Assistance needed to correctly position infant at breast and maintain latch. Intervention(s): Breastfeeding basics reviewed;Support Pillows;Position options;Skin to skin  LATCH Score: 5  Lactation Tools Discussed/Used     Consult Status Consult Status: Follow-up Date: 07/01/15 Follow-up type: In-patient    Charyl DancerCARVER, Genean Adamski G 07/01/2015, 3:36 AM

## 2015-07-01 NOTE — Progress Notes (Signed)
Urine output 50 ml in 2 hours. Urine amber and clear.  MD on call, Dr. Charlotta Newtonzan, notified and ordered a 500 ml LR bolus to run over 1 hour.  RN instructed by Dr. Charlotta Newtonzan not to pull urinary catheter until adequate output achieved.  Will continue to monitor.

## 2015-07-01 NOTE — Progress Notes (Addendum)
Postoperative Note Day # 1  S:  Patient resting comfortable in bed.  Pain controlled.  Tolerating general diet. No flatus, no BM.  Lochia minimal to moderate.  Not yet ambulating.  She denies n/v/f/c, SOB, or CP.  Pt plans on breastfeeding.  O: Temp:  [98 F (36.7 C)-101.3 F (38.5 C)] 98.1 F (36.7 C) (11/18 0800) Pulse Rate:  [65-115] 76 (11/18 0800) Resp:  [10-23] 16 (11/18 0800) BP: (86-141)/(47-78) 112/47 mmHg (11/18 0800) SpO2:  [95 %-99 %] 97 % (11/18 0800) Gen: A&Ox3, NAD CV: RRR, no MRG Resp: CTAB Abdomen: soft, NT, ND, BS quiet Uterus: firm, non-tender, below umbilicus Incision: c/d/i, bandage on Ext: No edema, no calf tenderness bilaterally, SCDs in place  Labs:  Recent Labs  06/29/15 2310 07/01/15 0558  HGB 13.0 10.1*    A/P: Pt is a [age] y.o. G2P1011 s/p Primary C-section, POD#1  - Pain well controlled -GU: UOP is adequate, plan to remove foley this am -GI: Tolerating general diet -Activity: encouraged sitting up to chair and ambulation as tolerated -Prophylaxis: early ambulation -Labs: appropriate, plan for iron daily -Plan for inpatient baby boy circ -Continue with routine postoperative care  Myna HidalgoJennifer Lataisha Colan, DO [phone] (pager) [phone] (office)

## 2015-07-01 NOTE — Anesthesia Postprocedure Evaluation (Signed)
  Anesthesia Post-op Note  Patient: Jennifer LatchYoon Brooks  Procedure(s) Performed: Procedure(s): CESAREAN SECTION (N/A)  Patient Location: Mother/Baby  Anesthesia Type:Epidural  Level of Consciousness: awake and alert   Airway and Oxygen Therapy: Patient Spontanous Breathing  Post-op Pain: mild  Post-op Assessment: Post-op Vital signs reviewed, Patient's Cardiovascular Status Stable, Respiratory Function Stable, No signs of Nausea or vomiting, Pain level controlled, No headache, Spinal receding and Patient able to bend at knees              Post-op Vital Signs: Reviewed  Last Vitals:  Filed Vitals:   07/01/15 0410  BP: 97/55  Pulse: 78  Temp:   Resp:     Complications: No apparent anesthesia complications

## 2015-07-02 NOTE — Progress Notes (Signed)
Postoperative Note Day # 2  S:  Patient resting comfortable in bed.  Pain controlled.  Tolerating general diet. + flatus, + BM.  Lochia minimal to moderate.  Ambulating without difficulty.  She denies n/v/f/c, SOB, or CP.  Pt plans on breastfeeding.  O: Temp:  [97.9 F (36.6 C)-98.2 F (36.8 C)] 98 F (36.7 C) (11/19 0646) Pulse Rate:  [61-72] 64 (11/19 0646) Resp:  [16-18] 16 (11/19 0646) BP: (104-113)/(45-62) 104/62 mmHg (11/19 0646) SpO2:  [97 %-99 %] 99 % (11/18 1606) Gen: A&Ox3, NAD CV: RRR, no MRG Resp: CTAB Abdomen: soft, NT, ND, BS quiet Uterus: firm, non-tender, below umbilicus Incision: c/d/i, bandage on Ext: No edema, no calf tenderness bilaterally, SCDs in place  Labs:   Recent Labs  06/29/15 2310 07/01/15 0558  HGB 13.0 10.1*    A/P: Pt is a 24 y.o. G2P1011 s/p Primary C-section, POD#2  - Pain well controlled -GU: Voiding freely -GI: Tolerating general diet -Activity: encouraged sitting up to chair and ambulation as tolerated -Prophylaxis: early ambulation -Labs: appropriate, plan for iron daily -Baby boy circ completed -Continue with routine postoperative care, plan for discharge home tomorrow  Myna HidalgoJennifer Berdena Cisek, DO [phone] (pager) [phone] (office)

## 2015-07-03 MED ORDER — IBUPROFEN 600 MG PO TABS: 600.0000 mg | ORAL_TABLET | Freq: Four times a day (QID) | ORAL | Status: DC

## 2015-07-03 MED ORDER — OXYCODONE-ACETAMINOPHEN 5-325 MG PO TABS: 1.0000 | ORAL_TABLET | Freq: Four times a day (QID) | ORAL | Status: DC | PRN

## 2015-07-03 MED ORDER — DOCUSATE SODIUM 100 MG PO CAPS: 100.0000 mg | ORAL_CAPSULE | Freq: Two times a day (BID) | ORAL | Status: DC

## 2015-07-03 NOTE — Lactation Note (Signed)
This note was copied from the chart of Jennifer Brooks [place]inger. Lactation Consultation Note  Patient Name: Jennifer Brooks Mallet ZOXWR'UToday's Date: 07/03/2015 Reason for consult: Follow-up assessment  Baby 65 hours old. Parents report that baby nursing/latching well now. Mom reports that baby swallowing at breasts and she is seeing colostrum. Mom states she has a little nipple tenderness, discussed using EBM. Parents had questions about baby's urine output. Enc parents to discuss with HCP if concerned. Discussed that urine should be light yellow, and that darker/brown urine indicates dehydration and the need to call baby's pediatrician. Discussed ways of knowing baby getting enough at breast. Discussed normal progression of milk coming to volume, and how to progress to pump/nurse and allow FOB to give a bottle a day. Parents aware of OP/BFSG and LC phone line assistance after D/C. Maternal Data    Feeding Feeding Type: Breast Fed  LATCH Score/Interventions Latch: Grasps breast easily, tongue down, lips flanged, rhythmical sucking.  Audible Swallowing: Spontaneous and intermittent  Type of Nipple: Everted at rest and after stimulation  Comfort (Breast/Nipple): Filling, red/small blisters or bruises, mild/mod discomfort  Problem noted: Mild/Moderate discomfort  Hold (Positioning): No assistance needed to correctly position infant at breast.  LATCH Score: 9  Lactation Tools Discussed/Used     Consult Status Consult Status: PRN    Geralynn OchsWILLIARD, Renezmae Canlas 07/03/2015, 11:29 AM

## 2015-07-03 NOTE — Discharge Instructions (Signed)
Cesarean Delivery, Care After Refer to this sheet in the next few weeks. These instructions provide you with information on caring for yourself after your procedure. Your health care provider may also give you specific instructions. Your treatment has been planned according to current medical practices, but problems sometimes occur. Call your health care provider if you have any problems or questions after you go home. HOME CARE INSTRUCTIONS  Only take over-the-counter or prescription medications as directed by your health care provider.  For pain management, please alternate between Percocet and Motrin for pain.  Percocet may cause constipation, so if you are taking the Percocet, please continue with the colace twice daily.  Do not drink alcohol, especially if you are breastfeeding or taking medication to relieve pain.  Do not chew or smoke tobacco.  Continue to use good perineal care. Good perineal care includes:  Wiping your perineum from front to back.  Keeping your perineum clean.  Check your surgical cut (incision) daily for increased redness, drainage, swelling, or separation of skin.  Clean your incision gently with soap and water every day, and then pat it dry. If your health care provider says it is okay, leave the incision uncovered. Use a bandage (dressing) if the incision is draining fluid or appears irritated. If the adhesive strips across the incision do not fall off within 7 days, carefully peel them off.  Hug a pillow when coughing or sneezing until your incision is healed. This helps to relieve pain.  Do not use tampons or douche until your health care provider says it is okay.  Shower, wash your hair, and take tub baths as directed by your health care provider.  Wear a well-fitting bra that provides breast support.  Limit wearing support panties or control-top hose.  Drink enough fluids to keep your urine clear or pale yellow.  Eat high-fiber foods such as whole  grain cereals and breads, brown rice, beans, and fresh fruits and vegetables every day. These foods may help prevent or relieve constipation.  Resume activities such as climbing stairs, driving, lifting, exercising, or traveling as directed by your health care provider.  Talk to your health care provider about resuming sexual activities. This is dependent upon your risk of infection, your rate of healing, and your comfort and desire to resume sexual activity.  Try to have someone help you with your household activities and your newborn for at least a few days after you leave the hospital.  Rest as much as possible. Try to rest or take a nap when your newborn is sleeping.  Increase your activities gradually.  Keep all of your scheduled postpartum appointments. It is very important to keep your scheduled follow-up appointments. At these appointments, your health care provider will be checking to make sure that you are healing physically and emotionally. SEEK MEDICAL CARE IF:   You are passing large clots from your vagina. Save any clots to show your health care provider.  You have a foul smelling discharge from your vagina.  You have trouble urinating.  You are urinating frequently.  You have pain when you urinate.  You have a change in your bowel movements.  You have increasing redness, pain, or swelling near your incision.  You have pus draining from your incision.  Your incision is separating.  You have painful, hard, or reddened breasts.  You have a severe headache.  You have blurred vision or see spots.  You feel sad or depressed.  You have thoughts of hurting  yourself or your newborn.  You have questions about your care, the care of your newborn, or medications.  You are dizzy or light-headed.  You have a rash.  You have pain, redness, or swelling at the site of the removed intravenous access (IV) tube.  You have nausea or vomiting.  You stopped breastfeeding  and have not had a menstrual period within 12 weeks of stopping.  You are not breastfeeding and have not had a menstrual period within 12 weeks of delivery.  You have a fever. SEEK IMMEDIATE MEDICAL CARE IF:  You have persistent pain.  You have chest pain.  You have shortness of breath.  You faint.  You have leg pain.  You have stomach pain.  Your vaginal bleeding saturates 2 or more sanitary pads in 1 hour. MAKE SURE YOU:   Understand these instructions.  Will watch your condition.  Will get help right away if you are not doing well or get worse.   This information is not intended to replace advice given to you by your health care provider. Make sure you discuss any questions you have with your health care provider.   Document Released: 04/21/2002 Document Revised: 08/20/2014 Document Reviewed: 03/26/2012 Elsevier Interactive Patient Education [employer].

## 2015-07-03 NOTE — Progress Notes (Signed)
Postoperative Note Day # 3  S:  Patient resting comfortable in bed.  Pt is not taking her pain medicine regularly and reports some discomfort/burning this am.  Tolerating general diet. + flatus, + BM.  Lochia minimal to moderate.  Ambulating without difficulty.  She denies n/v/f/c, SOB, or CP.  Pt plans on breastfeeding.  O: Temp:  [97.8 F (36.6 C)-98 F (36.7 C)] 97.8 F (36.6 C) (11/20 19140613) Pulse Rate:  [70-72] 70 (11/20 0613) Resp:  [16-18] 16 (11/20 0613) BP: (124-127)/(58-80) 127/80 mmHg (11/20 [id number]) Gen: A&Ox3, NAD CV: RRR, no MRG Resp: CTAB Abdomen: soft, NT, ND, BS quiet Uterus: firm, non-tender, below umbilicus Incision: c/d/i, bandage on Ext: No edema, no calf tenderness bilaterally, SCDs in place  Labs:  CBC Latest Ref Rng 07/01/2015 06/29/2015  WBC 4.0 - 10.5 K/uL 15.9(H) 10.3  Hemoglobin 12.0 - 15.0 g/dL 10.1(L) 13.0  Hematocrit 36.0 - 46.0 % 29.6(L) 37.7  Platelets 150 - 400 K/uL 181 228    A/P: Pt is a 24 y.o. G2P1011 s/p Primary C-section, POD#3  - Encouraged pt to take pain medication regularly, reviewed alternating motrin/percocet -GU: Voiding freely -GI: Tolerating general diet -Activity: encouraged sitting up to chair and ambulation as tolerated -Prophylaxis: early ambulation -Labs: appropriate, plan for iron daily -Baby boy circ completed Meeting postoperative milestones appropriately, plan for discharge home today  Myna HidalgoJennifer Efrata Brunner, DO [phone] (pager) [phone] (office)

## 2015-07-06 NOTE — Discharge Summary (Signed)
OB Discharge Summary     Patient Name: Jennifer Brooks DOB: Nov 09, 1990 MRN: [id number]  Date of admission: 06/29/2015 Delivering MD: Gerald Leitz   Date of discharge: 07/03/2015  Admitting diagnosis: 41.6W CTX 3 min apart, lot of pressure Intrauterine pregnancy: [redacted]w[redacted]d     Secondary diagnosis:  Active Problems:   Post-dates pregnancy   Chorioamnionitis, delivered, current hospitalization  Additional problems: none     Discharge diagnosis: Term Pregnancy Delivered                                                                                                Post partum procedures:none  Augmentation: AROM, Pitocin and Cytotec  Complications: Intrauterine Inflammation or infection (Chorioamniotis)  Hospital course:  Induction of Labor With Cesarean Section  24 y.o. yo G2P1011 at [redacted]w[redacted]d was admitted to the hospital 06/29/2015 for induction of labor. Patient had a labor course significant for chorioamnionitis. Pt was induced with cytotec and continued with Pitocin and AROM.  She received an epidural for pain management.  Her maximum dilation was 8cm and was taken for a primary C-section due to non-reassuring fetal heart tones. The patient went for cesarean section due to Non-Reassuring FHR, and delivered a Viable infant.  Delivery performed by Dr. Richardson Dopp- please see operative note.  Membrane Rupture Time/Date: )8:38 AM ,06/30/2015   Patient had an uncomplicated postpartum course. She is ambulating, tolerating a regular diet, passing flatus, and urinating well.  Patient is discharged home in stable condition on 07/03/2015  1:35 PM.                                     Physical exam  Filed Vitals:   07/01/15 1805 07/02/15 0646 07/02/15 1801 07/03/15 0613  BP: 105/45 104/62 124/58 127/80  Pulse: 61 64 72 70  Temp: 98 F (36.7 C) 98 F (36.7 C) 98 F (36.7 C) 97.8 F (36.6 C)  TempSrc: Oral Oral Oral   Resp: Height:      Weight:      SpO2:       General: alert, cooperative  and no distress Lochia: appropriate Uterine Fundus: firm Incision: Dressing is clean, dry, and intact DVT Evaluation: No evidence of DVT seen on physical exam. Labs: Lab Results  Component Value Date   WBC 15.9* 07/01/2015   HGB 10.1* 07/01/2015   HCT 29.6* 07/01/2015   MCV 91.6 07/01/2015   PLT 181 07/01/2015   CMP Latest Ref Rng 06/29/2015  Glucose 65 - 99 mg/dL 99  BUN 6 - 20 mg/dL 12  Creatinine 7.82 - 9.56 mg/dL 2.13  Sodium 086 - 578 mmol/L 135  Potassium 3.5 - 5.1 mmol/L 3.8  Chloride 101 - 111 mmol/L 105  CO2 22 - 32 mmol/L 21(L)  Calcium 8.9 - 10.3 mg/dL 9.4  Total Protein 6.5 - 8.1 g/dL 6.4(L)  Total Bilirubin 0.3 - 1.2 mg/dL 0.4  Alkaline Phos 38 - 126 U/L 271(H)  AST 15 - 41 U/L 32  ALT 14 - 54  U/L 32    Discharge instruction: per After Visit Summary and "Baby and Me Booklet".  After visit meds:    Medication List    TAKE these medications        docusate sodium 100 MG capsule  Commonly known as:  COLACE  Take 1 capsule (100 mg total) by mouth 2 (two) times daily.     ibuprofen 600 MG tablet  Commonly known as:  ADVIL,MOTRIN  Take 1 tablet (600 mg total) by mouth every 6 (six) hours.     oxyCODONE-acetaminophen 5-325 MG tablet  Commonly known as:  PERCOCET/ROXICET  Take 1 tablet by mouth every 6 (six) hours as needed for moderate pain (for pain scale 4-7).     prenatal multivitamin Tabs tablet  Take 1 tablet by mouth daily at 12 noon.        Diet: routine diet  Activity: Advance as tolerated. Pelvic rest for 6 weeks.   Outpatient follow up:2 weeks Follow up Appt:No future appointments. Follow up Visit:No Follow-up on file.  Postpartum contraception: Not Discussed  Newborn Data: Live born female  Birth Weight: 7 lb 6.9 oz (3370 g) APGAR: 9, 10  Baby Feeding: Breast Disposition:home with mother   07/06/2015 Myna HidalgoZAN, Janai Maudlin, M, DO

## 2015-11-01 ENCOUNTER — Other Ambulatory Visit: Payer: Self-pay | Admitting: Obstetrics and Gynecology

## 2015-11-01 ENCOUNTER — Other Ambulatory Visit (HOSPITAL_COMMUNITY)
Admission: RE | Admit: 2015-11-01 | Discharge: 2015-11-01 | Disposition: A | Discharge status: Home / Self Care | Payer: BLUE CROSS/BLUE SHIELD | Source: Ambulatory Visit | Attending: Obstetrics and Gynecology | Admitting: Obstetrics and Gynecology

## 2015-11-01 DIAGNOSIS — Z01419 Encounter for gynecological examination (general) (routine) without abnormal findings: Secondary | ICD-10-CM | POA: Insufficient documentation

## 2015-11-02 LAB — CYTOLOGY - PAP

## 2016-01-29 IMAGING — US US OB DETAIL+14 WK
1 series · 12 of 28 positions shown · non-contrast
Comparison: none

[Series 1: us ob detail+14 wk · 0.23mm/px · 12 of 60 slices shown]
[im 3/60]
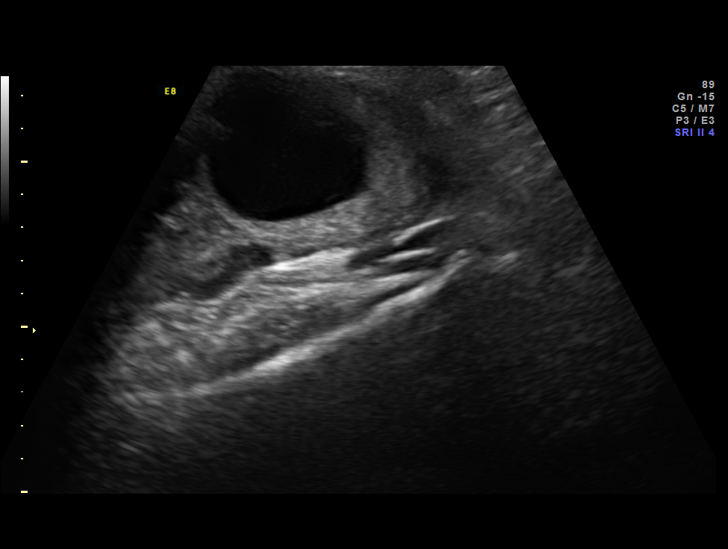
[im 7/60]
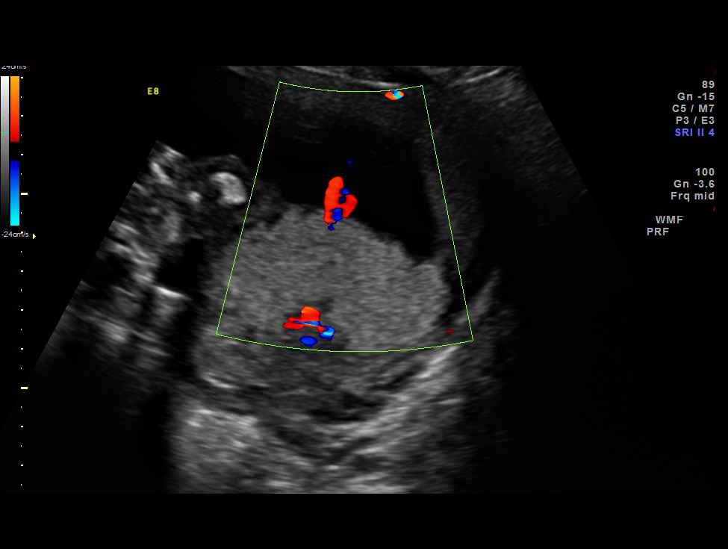
[im 11/60]
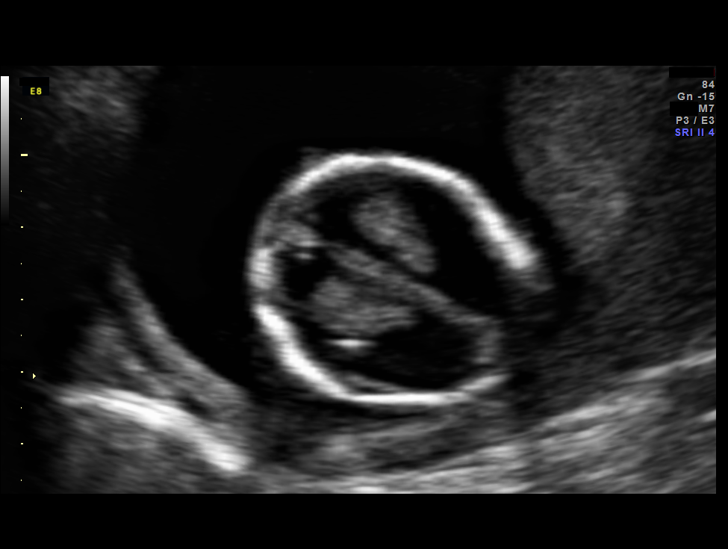
[im 18/60]
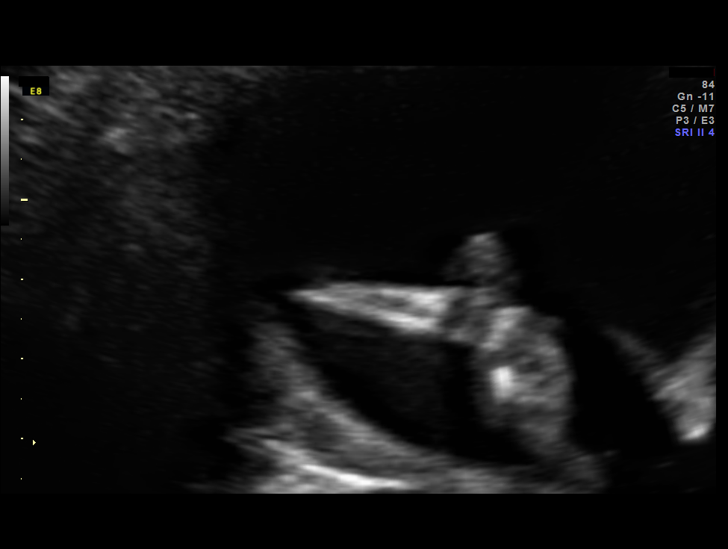
[im 22/60]
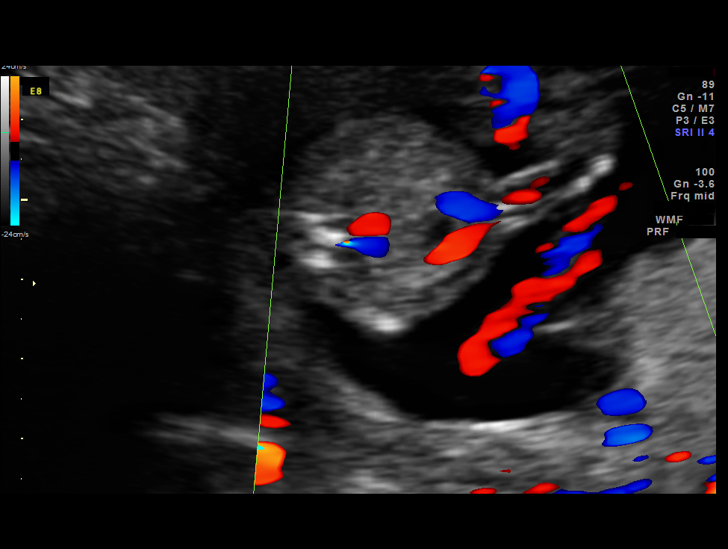
[im 27/60]
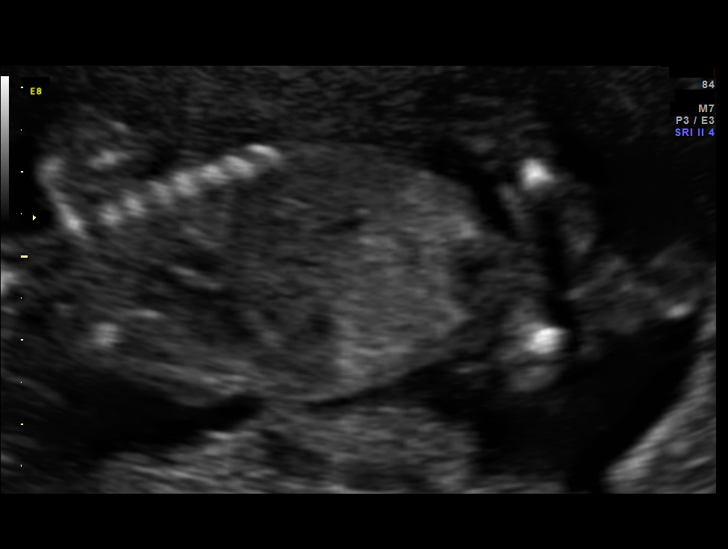
[im 33/60]
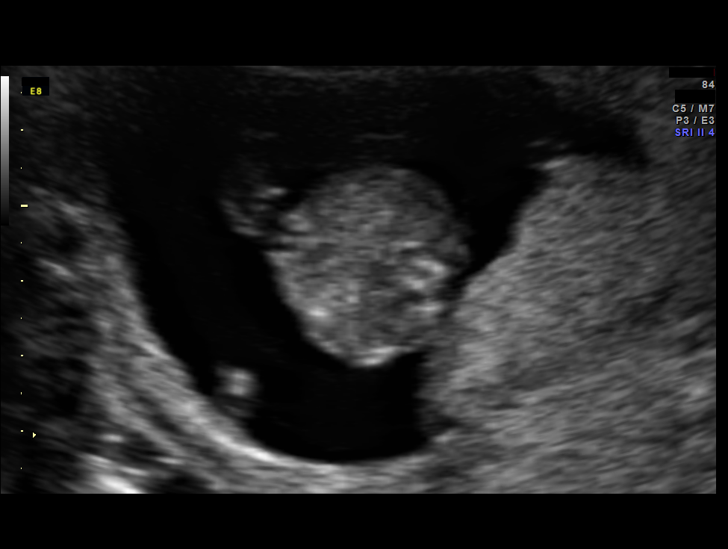
[im 38/60]
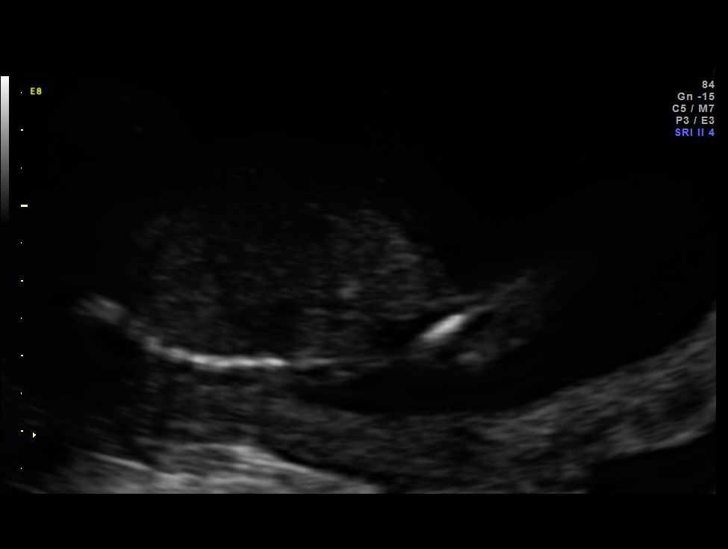
[im 42/60]
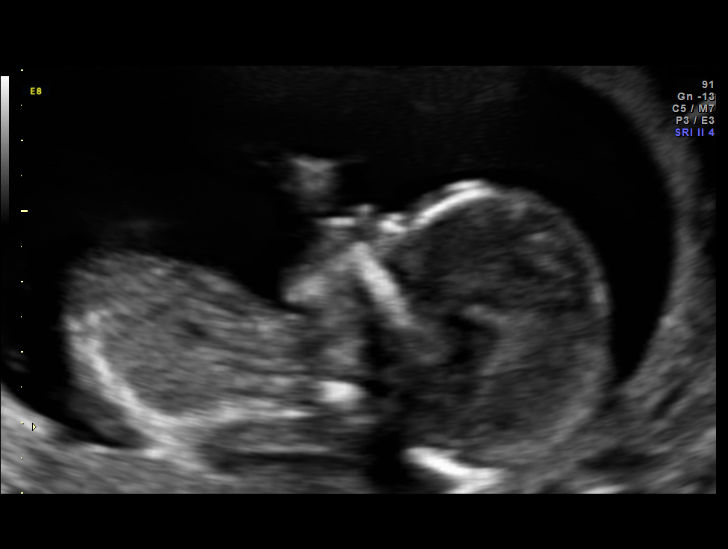
[im 49/60]
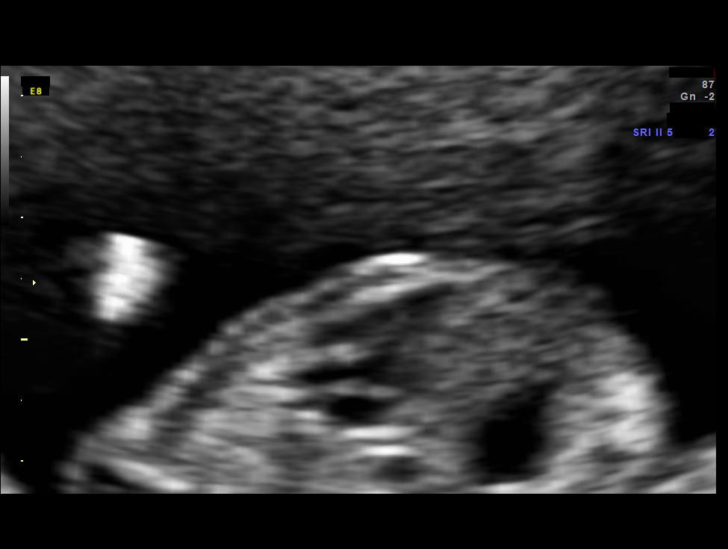
[im 53/60]
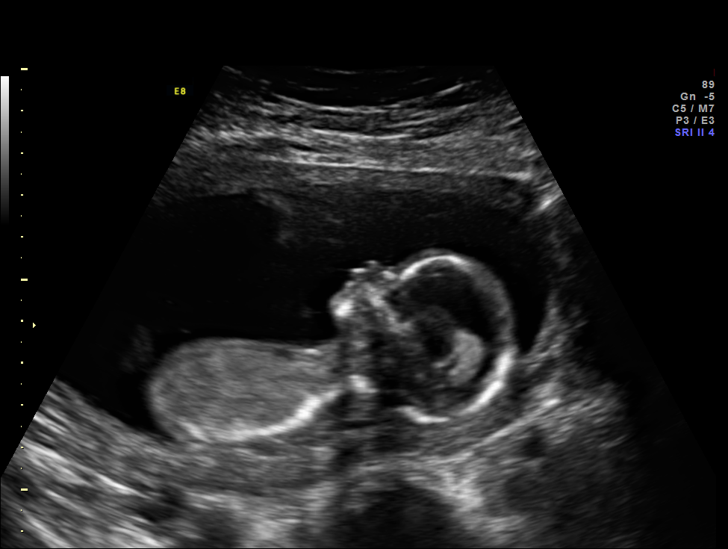
[im 57/60]
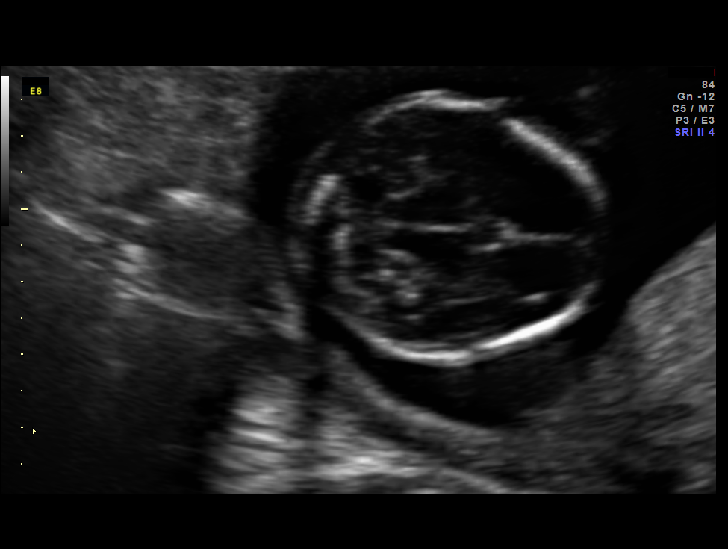

[12 of 28 positions shown; findings below may reference images not displayed]

OBSTETRICS REPORT
(Signed Final 01/07/2015 [DATE])

Service(s) Provided

US OB DETAIL + 14 WK                                  76811.0
Indications

Detailed fetal anatomic survey                        Z36
Abnormal biochemical screen (quad) for Trisomy
21 [DATE]- low risk NIPS
16 weeks gestation of pregnancy
Fetal Evaluation

Num Of Fetuses:    1
Fetal Heart Rate:  153                          bpm
Cardiac Activity:  Observed
Presentation:      Variable
Placenta:          Posterior Previa
P. Cord            Visualized
Insertion:

Amniotic Fluid
AFI FV:      Subjectively within normal limits
Larg Pckt:     4.9  cm
Biometry

BPD:     34.9  mm     G. Age:  16w 5d                CI:         80.4   70 - 86
OFD:     43.4  mm                                    FL/HC:      14.4   13.3 -
16.5
HC:     126.3  mm     G. Age:  16w 3d       50  %    HC/AC:      1.24   1.05 -
1.39
AC:     101.9  mm     G. Age:  16w 1d       53  %    FL/BPD:
FL:      18.2  mm     G. Age:  15w 3d       19  %    FL/AC:      17.9   20 - 24
HUM:       19  mm     G. Age:  15w 4d       36  %

Est. FW:     138  gm      0 lb 5 oz     60  %
Gestational Age

LMP:           18w 3d        Date:  08/31/14                 EDD:   06/07/15
U/S Today:     16w 1d                                        EDD:   06/23/15
Best:          16w 1d     Det. By:  U/S (01/07/15)           EDD:   06/23/15
Anatomy
Cranium:          Appears normal         Aortic Arch:      Not well visualized
Fetal Cavum:      Appears normal         Ductal Arch:      Not well visualized
Ventricles:       Appears normal         Diaphragm:        Appears normal
Choroid Plexus:   Appears normal         Stomach:          Appears normal, left
sided
Cerebellum:       Appears normal         Abdomen:          Appears normal
Posterior Fossa:  Appears normal         Abdominal Wall:   Appears nml (cord
insert, abd wall)
Nuchal Fold:      Appears normal         Cord Vessels:     Appears normal (3
vessel cord)
Face:             Appears normal         Kidneys:          Appear normal
(orbits and profile)
Lips:             Not well visualized    Bladder:          Appears normal
Heart:            Not well visualized    Spine:            Ltd views no
intracranial signs of
NTD
RVOT:             Not well visualized    Lower             Appears normal
Extremities:
LVOT:             Not well visualized    Upper             Appears normal
Extremities:

Other:  Fetus appears to be a male. Heels appears normal.
Cervix Uterus Adnexa

Cervix:       Normal appearance by transabdominal scan. Appears
closed, without funnelling.
Impression

Single IUP at 16w 1d by ultrasound today
Abnormal quad screen (was drawn outside of the appropriate
gestational window based on revised dates); Panorama
pending
Limited views of the fetal heart, lips and spine obtained
No gross anomalies noted
A posterior placenta previa is noted
Normal amniotic fluid volume
Recommendations

Recommend adjusting EDD based on today's study
Would offer MSAFP at next clinic visit to screen for ONTD
Ultrasound for reevaluate the fetal anatomy in 4 weeks

questions or concerns.

## 2016-11-01 ENCOUNTER — Other Ambulatory Visit: Payer: Self-pay | Admitting: Obstetrics and Gynecology

## 2016-11-01 ENCOUNTER — Other Ambulatory Visit (HOSPITAL_COMMUNITY)
Admission: RE | Admit: 2016-11-01 | Discharge: 2016-11-01 | Disposition: A | Discharge status: Home / Self Care | Payer: BLUE CROSS/BLUE SHIELD | Source: Ambulatory Visit | Attending: Obstetrics and Gynecology | Admitting: Obstetrics and Gynecology

## 2016-11-01 DIAGNOSIS — Z01419 Encounter for gynecological examination (general) (routine) without abnormal findings: Secondary | ICD-10-CM | POA: Insufficient documentation

## 2016-11-02 LAB — CYTOLOGY - PAP
Adequacy: ABSENT
Diagnosis: NEGATIVE

## 2017-03-13 LAB — OB RESULTS CONSOLE HIV ANTIBODY (ROUTINE TESTING): HIV: NONREACTIVE

## 2017-03-13 LAB — OB RESULTS CONSOLE ABO/RH: RH Type: POSITIVE

## 2017-03-13 LAB — OB RESULTS CONSOLE RUBELLA ANTIBODY, IGM: Rubella: IMMUNE

## 2017-03-13 LAB — OB RESULTS CONSOLE HEPATITIS B SURFACE ANTIGEN: HEP B S AG: NEGATIVE

## 2017-03-13 LAB — OB RESULTS CONSOLE ANTIBODY SCREEN: Antibody Screen: NEGATIVE

## 2017-03-13 LAB — OB RESULTS CONSOLE RPR: RPR: NONREACTIVE

## 2017-03-13 LAB — OB RESULTS CONSOLE GC/CHLAMYDIA
CHLAMYDIA, DNA PROBE: NEGATIVE
GC PROBE AMP, GENITAL: NEGATIVE

## 2017-10-16 ENCOUNTER — Encounter (HOSPITAL_COMMUNITY): Payer: Self-pay | Admitting: *Deleted

## 2017-10-16 ENCOUNTER — Telehealth (HOSPITAL_COMMUNITY): Payer: Self-pay | Admitting: *Deleted

## 2017-10-16 NOTE — Telephone Encounter (Signed)
Preadmission screen  

## 2017-10-17 ENCOUNTER — Inpatient Hospital Stay (HOSPITAL_COMMUNITY)
Admission: AD | Admit: 2017-10-17 | Discharge: 2017-10-17 | Disposition: A | Discharge status: Home / Self Care | Payer: BLUE CROSS/BLUE SHIELD | Source: Ambulatory Visit | Attending: Obstetrics and Gynecology | Admitting: Obstetrics and Gynecology

## 2017-10-17 ENCOUNTER — Encounter (HOSPITAL_COMMUNITY): Payer: Self-pay

## 2017-10-17 ENCOUNTER — Other Ambulatory Visit: Payer: Self-pay

## 2017-10-17 DIAGNOSIS — O479 False labor, unspecified: Secondary | ICD-10-CM

## 2017-10-17 DIAGNOSIS — O471 False labor at or after 37 completed weeks of gestation: Secondary | ICD-10-CM | POA: Insufficient documentation

## 2017-10-17 DIAGNOSIS — Z3A39 39 weeks gestation of pregnancy: Secondary | ICD-10-CM | POA: Diagnosis not present

## 2017-10-17 LAB — URINALYSIS, ROUTINE W REFLEX MICROSCOPIC
Bilirubin Urine: NEGATIVE
Glucose, UA: NEGATIVE mg/dL
Ketones, ur: NEGATIVE mg/dL
Nitrite: NEGATIVE
Protein, ur: NEGATIVE mg/dL
Specific Gravity, Urine: 1.006 (ref 1.005–1.030)
pH: 7 (ref 5.0–8.0)

## 2017-10-17 NOTE — Discharge Instructions (Signed)
Braxton Hicks Contractions °Contractions of the uterus can occur throughout pregnancy, but they are not always a sign that you are in labor. You may have practice contractions called Braxton Hicks contractions. These false labor contractions are sometimes confused with true labor. °What are Braxton Hicks contractions? °Braxton Hicks contractions are tightening movements that occur in the muscles of the uterus before labor. Unlike true labor contractions, these contractions do not result in opening (dilation) and thinning of the cervix. Toward the end of pregnancy (32-34 weeks), Braxton Hicks contractions can happen more often and may become stronger. These contractions are sometimes difficult to tell apart from true labor because they can be very uncomfortable. You should not feel embarrassed if you go to the hospital with false labor. °Sometimes, the only way to tell if you are in true labor is for your health care provider to look for changes in the cervix. The health care provider will do a physical exam and may monitor your contractions. If you are not in true labor, the exam should show that your cervix is not dilating and your water has not broken. °If there are other health problems associated with your pregnancy, it is completely safe for you to be sent home with false labor. You may continue to have Braxton Hicks contractions until you go into true labor. °How to tell the difference between true labor and false labor °True labor °· Contractions last 30-70 seconds. °· Contractions become very regular. °· Discomfort is usually felt in the top of the uterus, and it spreads to the lower abdomen and low back. °· Contractions do not go away with walking. °· Contractions usually become more intense and increase in frequency. °· The cervix dilates and gets thinner. °False labor °· Contractions are usually shorter and not as strong as true labor contractions. °· Contractions are usually irregular. °· Contractions  are often felt in the front of the lower abdomen and in the groin. °· Contractions may go away when you walk around or change positions while lying down. °· Contractions get weaker and are shorter-lasting as time goes on. °· The cervix usually does not dilate or become thin. °Follow these instructions at home: °· Take over-the-counter and prescription medicines only as told by your health care provider. °· Keep up with your usual exercises and follow other instructions from your health care provider. °· Eat and drink lightly if you think you are going into labor. °· If Braxton Hicks contractions are making you uncomfortable: °? Change your position from lying down or resting to walking, or change from walking to resting. °? Sit and rest in a tub of warm water. °? Drink enough fluid to keep your urine pale yellow. Dehydration may cause these contractions. °? Do slow and deep breathing several times an hour. °· Keep all follow-up prenatal visits as told by your health care provider. This is important. °Contact a health care provider if: °· You have a fever. °· You have continuous pain in your abdomen. °Get help right away if: °· Your contractions become stronger, more regular, and closer together. °· You have fluid leaking or gushing from your vagina. °· You pass blood-tinged mucus (bloody show). °· You have bleeding from your vagina. °· You have low back pain that you never had before. °· You feel your baby’s head pushing down and causing pelvic pressure. °· Your baby is not moving inside you as much as it used to. °Summary °· Contractions that occur before labor are called Braxton   Hicks contractions, false labor, or practice contractions. °· Braxton Hicks contractions are usually shorter, weaker, farther apart, and less regular than true labor contractions. True labor contractions usually become progressively stronger and regular and they become more frequent. °· Manage discomfort from Braxton Hicks contractions by  changing position, resting in a warm bath, drinking plenty of water, or practicing deep breathing. °This information is not intended to replace advice given to you by your health care provider. Make sure you discuss any questions you have with your health care provider. °Document Released: 12/13/2016 Document Revised: 12/13/2016 Document Reviewed: 12/13/2016 °Elsevier Interactive Patient Education © 2018 [employer] ° °

## 2017-10-17 NOTE — Progress Notes (Addendum)
G3P1 @ 39.[redacted] wksga. Here dt loss of mucus plug this morning and "bright red bleeding". Denies LOF. +FM  VSS see flow sheet for details.   Hx: previous c/s for fetal distress. Plans for tolac.   SVE: 1.5/thick/-3  1907: Reported off to RN [employer]

## 2017-10-17 NOTE — MAU Note (Signed)
I have communicated with Dr. Renaldo FiddlerAdkins and reviewed vital signs:  Vitals:   10/17/17 1931 10/17/17 1946  BP: 137/89 133/74  Pulse: 84 84  Resp:    Temp:    SpO2:      Vaginal exam:  Dilation: 1.5 Effacement (%): 20, 30 Cervical Position: Posterior Station: -3 Presentation: Vertex Exam by:: Latricia HeftAnna Elle Vezina, RN,   Also reviewed contraction pattern and that non-stress test is reactive.  It has been documented that patient is contracting irregularly with no cervical change over 1 hour not indicating active labor.  Patient denies any other complaints.  Based on this report provider has given order for discharge.  A discharge order and diagnosis will be entered by a provider.   Labor discharge instructions reviewed with patient.

## 2017-10-21 ENCOUNTER — Encounter (HOSPITAL_COMMUNITY)
Admission: AD | Disposition: A | Discharge status: Home / Self Care | Payer: Self-pay | Source: Ambulatory Visit | Attending: Obstetrics and Gynecology

## 2017-10-21 ENCOUNTER — Encounter (HOSPITAL_COMMUNITY): Payer: Self-pay | Admitting: *Deleted

## 2017-10-21 ENCOUNTER — Inpatient Hospital Stay (HOSPITAL_COMMUNITY): Payer: BLUE CROSS/BLUE SHIELD | Admitting: Anesthesiology

## 2017-10-21 ENCOUNTER — Inpatient Hospital Stay (HOSPITAL_COMMUNITY)
Admission: AD | Admit: 2017-10-21 | Discharge: 2017-10-23 | DRG: 788 | Disposition: A | Discharge status: Home / Self Care | Payer: BLUE CROSS/BLUE SHIELD | Source: Ambulatory Visit | Attending: Obstetrics and Gynecology | Admitting: Obstetrics and Gynecology

## 2017-10-21 DIAGNOSIS — Z3A40 40 weeks gestation of pregnancy: Secondary | ICD-10-CM

## 2017-10-21 DIAGNOSIS — O34211 Maternal care for low transverse scar from previous cesarean delivery: Secondary | ICD-10-CM | POA: Diagnosis present

## 2017-10-21 DIAGNOSIS — Z98891 History of uterine scar from previous surgery: Secondary | ICD-10-CM

## 2017-10-21 DIAGNOSIS — IMO0002 Reserved for concepts with insufficient information to code with codable children: Secondary | ICD-10-CM | POA: Diagnosis present

## 2017-10-21 LAB — CBC
HCT: 37.3 % (ref 36.0–46.0)
Hemoglobin: 12.7 g/dL (ref 12.0–15.0)
MCH: 31.1 pg (ref 26.0–34.0)
MCHC: 34 g/dL (ref 30.0–36.0)
MCV: 91.4 fL (ref 78.0–100.0)
PLATELETS: 235 10*3/uL (ref 150–400)
RBC: 4.08 MIL/uL (ref 3.87–5.11)
RDW: 12.8 % (ref 11.5–15.5)
WBC: 11.1 10*3/uL — AB (ref 4.0–10.5)

## 2017-10-21 LAB — COMPREHENSIVE METABOLIC PANEL
ALT: 24 U/L (ref 14–54)
ANION GAP: 9 (ref 5–15)
AST: 22 U/L (ref 15–41)
Albumin: 3.3 g/dL — ABNORMAL LOW (ref 3.5–5.0)
Alkaline Phosphatase: 271 U/L — ABNORMAL HIGH (ref 38–126)
BUN: 11 mg/dL (ref 6–20)
CHLORIDE: 103 mmol/L (ref 101–111)
CO2: 22 mmol/L (ref 22–32)
Calcium: 8.4 mg/dL — ABNORMAL LOW (ref 8.9–10.3)
Creatinine, Ser: 0.52 mg/dL (ref 0.44–1.00)
Glucose, Bld: 97 mg/dL (ref 65–99)
POTASSIUM: 3.9 mmol/L (ref 3.5–5.1)
Sodium: 134 mmol/L — ABNORMAL LOW (ref 135–145)
TOTAL PROTEIN: 6.7 g/dL (ref 6.5–8.1)
Total Bilirubin: 0.6 mg/dL (ref 0.3–1.2)

## 2017-10-21 LAB — TYPE AND SCREEN
ABO/RH(D): B POS
ANTIBODY SCREEN: NEGATIVE

## 2017-10-21 LAB — URINALYSIS, ROUTINE W REFLEX MICROSCOPIC
Bilirubin Urine: NEGATIVE
Glucose, UA: NEGATIVE mg/dL
HGB URINE DIPSTICK: NEGATIVE
Ketones, ur: NEGATIVE mg/dL
Leukocytes, UA: NEGATIVE
Nitrite: NEGATIVE
Protein, ur: NEGATIVE mg/dL
Specific Gravity, Urine: 1.004 — ABNORMAL LOW (ref 1.005–1.030)
pH: 7 (ref 5.0–8.0)

## 2017-10-21 LAB — PROTEIN / CREATININE RATIO, URINE: Creatinine, Urine: 31 mg/dL

## 2017-10-21 LAB — RPR: RPR: NONREACTIVE

## 2017-10-21 LAB — OB RESULTS CONSOLE GBS: STREP GROUP B AG: NEGATIVE

## 2017-10-21 SURGERY — Surgical Case
Anesthesia: Epidural

## 2017-10-21 MED ORDER — WITCH HAZEL-GLYCERIN EX PADS: 1.0000 "application " | MEDICATED_PAD | CUTANEOUS | Status: DC | PRN

## 2017-10-21 MED ORDER — LACTATED RINGERS IV SOLN
INTRAVENOUS | Status: DC | PRN
Start: 2017-10-21 — End: 2017-10-21
  Administered 2017-10-21 (×2): via INTRAVENOUS

## 2017-10-21 MED ORDER — FENTANYL CITRATE (PF) 100 MCG/2ML IJ SOLN: 50.0000 ug | INTRAMUSCULAR | Status: DC | PRN

## 2017-10-21 MED ORDER — PHENYLEPHRINE 40 MCG/ML (10ML) SYRINGE FOR IV PUSH (FOR BLOOD PRESSURE SUPPORT)
PREFILLED_SYRINGE | INTRAVENOUS | Status: AC
  Filled 2017-10-21: qty 10

## 2017-10-21 MED ORDER — EPHEDRINE 5 MG/ML INJ: 10.0000 mg | INTRAVENOUS | Status: DC | PRN

## 2017-10-21 MED ORDER — DIPHENHYDRAMINE HCL 50 MG/ML IJ SOLN: 12.5000 mg | INTRAMUSCULAR | Status: DC | PRN

## 2017-10-21 MED ORDER — MORPHINE SULFATE (PF) 0.5 MG/ML IJ SOLN
INTRAMUSCULAR | Status: DC | PRN
  Administered 2017-10-21: 1 mg via INTRAVENOUS
  Administered 2017-10-21: 4 mg via EPIDURAL

## 2017-10-21 MED ORDER — SENNOSIDES-DOCUSATE SODIUM 8.6-50 MG PO TABS
2.0000 | ORAL_TABLET | ORAL | Status: DC
  Administered 2017-10-23: 2 via ORAL
  Filled 2017-10-21: qty 2

## 2017-10-21 MED ORDER — ONDANSETRON HCL 4 MG/2ML IJ SOLN
INTRAMUSCULAR | Status: DC | PRN
  Administered 2017-10-21: 4 mg via INTRAVENOUS

## 2017-10-21 MED ORDER — SCOPOLAMINE 1 MG/3DAYS TD PT72
1.0000 | MEDICATED_PATCH | Freq: Once | TRANSDERMAL | Status: DC
  Filled 2017-10-21: qty 1

## 2017-10-21 MED ORDER — DIPHENHYDRAMINE HCL 25 MG PO CAPS: 25.0000 mg | ORAL_CAPSULE | Freq: Four times a day (QID) | ORAL | Status: DC | PRN

## 2017-10-21 MED ORDER — TERBUTALINE SULFATE 1 MG/ML IJ SOLN: 0.2500 mg | Freq: Once | INTRAMUSCULAR | Status: DC | PRN

## 2017-10-21 MED ORDER — OXYCODONE-ACETAMINOPHEN 5-325 MG PO TABS: 1.0000 | ORAL_TABLET | ORAL | Status: DC | PRN

## 2017-10-21 MED ORDER — LACTATED RINGERS IV SOLN
INTRAVENOUS | Status: DC | PRN
  Administered 2017-10-21: 19:00:00 via INTRAVENOUS

## 2017-10-21 MED ORDER — OXYTOCIN 10 UNIT/ML IJ SOLN
INTRAVENOUS | Status: DC | PRN
  Administered 2017-10-21: 40 [IU] via INTRAVENOUS

## 2017-10-21 MED ORDER — SODIUM BICARBONATE 8.4 % IV SOLN
INTRAVENOUS | Status: DC | PRN
  Administered 2017-10-21 (×4): 5 mL via EPIDURAL

## 2017-10-21 MED ORDER — DIBUCAINE 1 % RE OINT: 1.0000 "application " | TOPICAL_OINTMENT | RECTAL | Status: DC | PRN

## 2017-10-21 MED ORDER — LACTATED RINGERS IV SOLN: INTRAVENOUS | Status: DC

## 2017-10-21 MED ORDER — OXYTOCIN 40 UNITS IN LACTATED RINGERS INFUSION - SIMPLE MED
2.5000 [IU]/h | INTRAVENOUS | Status: DC
  Filled 2017-10-21: qty 1000

## 2017-10-21 MED ORDER — FENTANYL CITRATE (PF) 100 MCG/2ML IJ SOLN: 25.0000 ug | INTRAMUSCULAR | Status: DC | PRN

## 2017-10-21 MED ORDER — FENTANYL CITRATE (PF) 100 MCG/2ML IJ SOLN
INTRAMUSCULAR | Status: DC | PRN
  Administered 2017-10-21: 50 ug via INTRAVENOUS

## 2017-10-21 MED ORDER — SODIUM CHLORIDE 0.9% FLUSH: 3.0000 mL | INTRAVENOUS | Status: DC | PRN

## 2017-10-21 MED ORDER — SIMETHICONE 80 MG PO CHEW
80.0000 mg | CHEWABLE_TABLET | Freq: Three times a day (TID) | ORAL | Status: DC
  Administered 2017-10-22 – 2017-10-23 (×3): 80 mg via ORAL
  Filled 2017-10-21 (×3): qty 1

## 2017-10-21 MED ORDER — FENTANYL CITRATE (PF) 100 MCG/2ML IJ SOLN
INTRAMUSCULAR | Status: AC
  Filled 2017-10-21: qty 2

## 2017-10-21 MED ORDER — ACETAMINOPHEN 325 MG PO TABS
650.0000 mg | ORAL_TABLET | ORAL | Status: DC | PRN
  Administered 2017-10-21: 650 mg via ORAL
  Filled 2017-10-21: qty 2

## 2017-10-21 MED ORDER — ONDANSETRON HCL 4 MG/2ML IJ SOLN: 4.0000 mg | Freq: Four times a day (QID) | INTRAMUSCULAR | Status: DC | PRN

## 2017-10-21 MED ORDER — OXYTOCIN BOLUS FROM INFUSION: 500.0000 mL | Freq: Once | INTRAVENOUS | Status: DC

## 2017-10-21 MED ORDER — MORPHINE SULFATE (PF) 0.5 MG/ML IJ SOLN
INTRAMUSCULAR | Status: AC
  Filled 2017-10-21: qty 10

## 2017-10-21 MED ORDER — ZOLPIDEM TARTRATE 5 MG PO TABS: 5.0000 mg | ORAL_TABLET | Freq: Every evening | ORAL | Status: DC | PRN

## 2017-10-21 MED ORDER — MEPERIDINE HCL 25 MG/ML IJ SOLN: 6.2500 mg | INTRAMUSCULAR | Status: DC | PRN

## 2017-10-21 MED ORDER — OXYTOCIN 40 UNITS IN LACTATED RINGERS INFUSION - SIMPLE MED
1.0000 m[IU]/min | INTRAVENOUS | Status: DC
  Administered 2017-10-21: 2 m[IU]/min via INTRAVENOUS

## 2017-10-21 MED ORDER — SIMETHICONE 80 MG PO CHEW
80.0000 mg | CHEWABLE_TABLET | ORAL | Status: DC
  Administered 2017-10-23: 80 mg via ORAL
  Filled 2017-10-21: qty 1

## 2017-10-21 MED ORDER — NALBUPHINE HCL 10 MG/ML IJ SOLN: 5.0000 mg | INTRAMUSCULAR | Status: DC | PRN

## 2017-10-21 MED ORDER — PHENYLEPHRINE 40 MCG/ML (10ML) SYRINGE FOR IV PUSH (FOR BLOOD PRESSURE SUPPORT): 80.0000 ug | PREFILLED_SYRINGE | INTRAVENOUS | Status: DC | PRN

## 2017-10-21 MED ORDER — TETANUS-DIPHTH-ACELL PERTUSSIS 5-2.5-18.5 LF-MCG/0.5 IM SUSP: 0.5000 mL | Freq: Once | INTRAMUSCULAR | Status: DC

## 2017-10-21 MED ORDER — DEXAMETHASONE SODIUM PHOSPHATE 10 MG/ML IJ SOLN
INTRAMUSCULAR | Status: DC | PRN
  Administered 2017-10-21: 10 mg via INTRAVENOUS

## 2017-10-21 MED ORDER — PROMETHAZINE HCL 25 MG/ML IJ SOLN: 6.2500 mg | INTRAMUSCULAR | Status: DC | PRN

## 2017-10-21 MED ORDER — FLEET ENEMA 7-19 GM/118ML RE ENEM: 1.0000 | ENEMA | RECTAL | Status: DC | PRN

## 2017-10-21 MED ORDER — ACETAMINOPHEN 325 MG PO TABS
650.0000 mg | ORAL_TABLET | ORAL | Status: DC | PRN
  Administered 2017-10-22: 650 mg via ORAL
  Filled 2017-10-21 (×2): qty 2

## 2017-10-21 MED ORDER — LIDOCAINE HCL (PF) 1 % IJ SOLN: 30.0000 mL | INTRAMUSCULAR | Status: DC | PRN

## 2017-10-21 MED ORDER — LACTATED RINGERS IV SOLN
500.0000 mL | Freq: Once | INTRAVENOUS | Status: AC
  Administered 2017-10-21: 500 mL via INTRAVENOUS

## 2017-10-21 MED ORDER — LACTATED RINGERS IV SOLN: 500.0000 mL | INTRAVENOUS | Status: DC | PRN

## 2017-10-21 MED ORDER — PRENATAL MULTIVITAMIN CH
1.0000 | ORAL_TABLET | Freq: Every day | ORAL | Status: DC
  Administered 2017-10-22 – 2017-10-23 (×2): 1 via ORAL
  Filled 2017-10-21 (×2): qty 1

## 2017-10-21 MED ORDER — ONDANSETRON HCL 4 MG/2ML IJ SOLN
4.0000 mg | Freq: Three times a day (TID) | INTRAMUSCULAR | Status: DC | PRN
Start: 2017-10-21 — End: 2017-10-23

## 2017-10-21 MED ORDER — BUPIVACAINE HCL (PF) 0.25 % IJ SOLN
INTRAMUSCULAR | Status: AC
  Filled 2017-10-21: qty 10

## 2017-10-21 MED ORDER — PHENYLEPHRINE 40 MCG/ML (10ML) SYRINGE FOR IV PUSH (FOR BLOOD PRESSURE SUPPORT)
80.0000 ug | PREFILLED_SYRINGE | INTRAVENOUS | Status: DC | PRN
  Filled 2017-10-21: qty 10

## 2017-10-21 MED ORDER — CEFAZOLIN SODIUM-DEXTROSE 2-3 GM-%(50ML) IV SOLR
INTRAVENOUS | Status: DC | PRN
  Administered 2017-10-21: 2 g via INTRAVENOUS

## 2017-10-21 MED ORDER — NALBUPHINE HCL 10 MG/ML IJ SOLN: 5.0000 mg | Freq: Once | INTRAMUSCULAR | Status: DC | PRN

## 2017-10-21 MED ORDER — OXYTOCIN 40 UNITS IN LACTATED RINGERS INFUSION - SIMPLE MED: 2.5000 [IU]/h | INTRAVENOUS | Status: AC

## 2017-10-21 MED ORDER — SCOPOLAMINE 1 MG/3DAYS TD PT72
MEDICATED_PATCH | TRANSDERMAL | Status: DC | PRN
  Administered 2017-10-21: 1 via TRANSDERMAL

## 2017-10-21 MED ORDER — LIDOCAINE HCL (PF) 1 % IJ SOLN
INTRAMUSCULAR | Status: DC | PRN
  Administered 2017-10-21 (×2): 5 mL via EPIDURAL

## 2017-10-21 MED ORDER — SIMETHICONE 80 MG PO CHEW: 80.0000 mg | CHEWABLE_TABLET | ORAL | Status: DC | PRN

## 2017-10-21 MED ORDER — OXYCODONE-ACETAMINOPHEN 5-325 MG PO TABS: 2.0000 | ORAL_TABLET | ORAL | Status: DC | PRN

## 2017-10-21 MED ORDER — DIPHENHYDRAMINE HCL 25 MG PO CAPS: 25.0000 mg | ORAL_CAPSULE | ORAL | Status: DC | PRN

## 2017-10-21 MED ORDER — DEXTROSE 5 % IV SOLN
1.0000 ug/kg/h | INTRAVENOUS | Status: DC | PRN
  Filled 2017-10-21: qty 5

## 2017-10-21 MED ORDER — FENTANYL 2.5 MCG/ML BUPIVACAINE 1/10 % EPIDURAL INFUSION (WH - ANES)
14.0000 mL/h | INTRAMUSCULAR | Status: DC | PRN
  Administered 2017-10-21 (×3): 14 mL/h via EPIDURAL
  Filled 2017-10-21 (×3): qty 100

## 2017-10-21 MED ORDER — SOD CITRATE-CITRIC ACID 500-334 MG/5ML PO SOLN
30.0000 mL | ORAL | Status: DC | PRN
  Administered 2017-10-21: 30 mL via ORAL
  Filled 2017-10-21: qty 15

## 2017-10-21 MED ORDER — LIDOCAINE-EPINEPHRINE (PF) 2 %-1:200000 IJ SOLN
INTRAMUSCULAR | Status: DC | PRN
  Administered 2017-10-21: 3 mL via EPIDURAL

## 2017-10-21 MED ORDER — NALOXONE HCL 0.4 MG/ML IJ SOLN: 0.4000 mg | INTRAMUSCULAR | Status: DC | PRN

## 2017-10-21 MED ORDER — COCONUT OIL OIL: 1.0000 "application " | TOPICAL_OIL | Status: DC | PRN

## 2017-10-21 MED ORDER — OXYCODONE-ACETAMINOPHEN 5-325 MG PO TABS
2.0000 | ORAL_TABLET | ORAL | Status: DC | PRN
  Administered 2017-10-22 – 2017-10-23 (×4): 2 via ORAL
  Filled 2017-10-21 (×4): qty 2

## 2017-10-21 MED ORDER — MEPERIDINE HCL 25 MG/ML IJ SOLN
INTRAMUSCULAR | Status: AC
  Filled 2017-10-21: qty 1

## 2017-10-21 MED ORDER — MENTHOL 3 MG MT LOZG
1.0000 | LOZENGE | OROMUCOSAL | Status: DC | PRN
  Administered 2017-10-22: 3 mg via ORAL
  Filled 2017-10-21: qty 9

## 2017-10-21 MED ORDER — LACTATED RINGERS IV SOLN
INTRAVENOUS | Status: DC
  Administered 2017-10-21 (×4): via INTRAVENOUS

## 2017-10-21 MED ORDER — MEPERIDINE HCL 25 MG/ML IJ SOLN
INTRAMUSCULAR | Status: DC | PRN
  Administered 2017-10-21: 12.5 mg via INTRAVENOUS

## 2017-10-21 MED ORDER — IBUPROFEN 600 MG PO TABS
600.0000 mg | ORAL_TABLET | Freq: Four times a day (QID) | ORAL | Status: DC
  Administered 2017-10-22 – 2017-10-23 (×6): 600 mg via ORAL
  Filled 2017-10-21 (×6): qty 1

## 2017-10-21 SURGICAL SUPPLY — 33 items
BARRIER ADHS 3X4 INTERCEED (GAUZE/BANDAGES/DRESSINGS) IMPLANT
BENZOIN TINCTURE PRP APPL 2/3 (GAUZE/BANDAGES/DRESSINGS) ×3 IMPLANT
CHLORAPREP W/TINT 26ML (MISCELLANEOUS) ×3 IMPLANT
CLAMP CORD UMBIL (MISCELLANEOUS) IMPLANT
CLOSURE WOUND 1/2 X4 (GAUZE/BANDAGES/DRESSINGS) ×1
CLOTH BEACON ORANGE TIMEOUT ST (SAFETY) ×3 IMPLANT
DRSG OPSITE POSTOP 4X10 (GAUZE/BANDAGES/DRESSINGS) ×3 IMPLANT
ELECT REM PT RETURN 9FT ADLT (ELECTROSURGICAL) ×3
ELECTRODE REM PT RTRN 9FT ADLT (ELECTROSURGICAL) ×1 IMPLANT
EXTRACTOR VACUUM M CUP 4 TUBE (SUCTIONS) IMPLANT
EXTRACTOR VACUUM M CUP 4' TUBE (SUCTIONS)
GLOVE BIO SURGEON STRL SZ 6.5 (GLOVE) ×2 IMPLANT
GLOVE BIO SURGEONS STRL SZ 6.5 (GLOVE) ×1
GLOVE BIOGEL PI IND STRL 7.0 (GLOVE) ×1 IMPLANT
GLOVE BIOGEL PI INDICATOR 7.0 (GLOVE) ×2
GOWN STRL REUS W/TWL LRG LVL3 (GOWN DISPOSABLE) ×6 IMPLANT
KIT ABG SYR 3ML LUER SLIP (SYRINGE) IMPLANT
NEEDLE HYPO 22GX1.5 SAFETY (NEEDLE) IMPLANT
NEEDLE HYPO 25X5/8 SAFETYGLIDE (NEEDLE) ×3 IMPLANT
NS IRRIG 1000ML POUR BTL (IV SOLUTION) ×3 IMPLANT
PACK C SECTION WH (CUSTOM PROCEDURE TRAY) ×3 IMPLANT
PAD OB MATERNITY 4.3X12.25 (PERSONAL CARE ITEMS) ×3 IMPLANT
PENCIL SMOKE EVAC W/HOLSTER (ELECTROSURGICAL) ×3 IMPLANT
STRIP CLOSURE SKIN 1/2X4 (GAUZE/BANDAGES/DRESSINGS) ×2 IMPLANT
SUT CHROMIC 0 CTX 36 (SUTURE) ×6 IMPLANT
SUT PLAIN 0 NONE (SUTURE) IMPLANT
SUT PLAIN 2 0 XLH (SUTURE) IMPLANT
SUT VIC AB 0 CT1 27 (SUTURE) ×6
SUT VIC AB 0 CT1 27XBRD ANBCTR (SUTURE) ×3 IMPLANT
SUT VIC AB 4-0 KS 27 (SUTURE) IMPLANT
SYR CONTROL 10ML LL (SYRINGE) IMPLANT
TOWEL OR 17X24 6PK STRL BLUE (TOWEL DISPOSABLE) ×3 IMPLANT
TRAY FOLEY BAG SILVER LF 14FR (SET/KITS/TRAYS/PACK) ×3 IMPLANT

## 2017-10-21 NOTE — MAU Note (Signed)
Pt reports uc's since yesterday. Contractions much stronger for the last 2-3 hours.

## 2017-10-21 NOTE — Anesthesia Pain Management Evaluation Note (Signed)
  CRNA Pain Management Visit Note  Patient: Jennifer Brooks, 27 y.o., female  "Hello I am a member of the anesthesia team at [facility]. We have an anesthesia team available at all times to provide care throughout the hospital, including epidural management and anesthesia for C-section. I don't know your plan for the delivery whether it a natural birth, water birth, IV sedation, nitrous supplementation, doula or epidural, but we want to meet your pain goals."   1.Was your pain managed to your expectations on prior hospitalizations?   Yes   2.What is your expectation for pain management during this hospitalization?     Epidural  3.How can we help you reach that goal? Maintain epidural  Record the patient's initial score and the patient's pain goal.   Pain: 2 - pressure  Pain Goal: 6 [facility] wants you to be able to say your pain was always managed very well.  Louretta Tantillo 10/21/2017

## 2017-10-21 NOTE — Anesthesia Preprocedure Evaluation (Signed)

## 2017-10-21 NOTE — Transfer of Care (Signed)
Immediate Anesthesia Transfer of Care Note  Patient: Jennifer Brooks  Procedure(s) Performed: CESAREAN SECTION (N/A )  Patient Location: PACU  Anesthesia Type:Epidural  Level of Consciousness: awake, alert  and oriented  Airway & Oxygen Therapy: Patient Spontanous Breathing  Post-op Assessment: Report given to RN and Post -op Vital signs reviewed and stable  Post vital signs: Reviewed and stable   HR 116 BP 138/52 resp 15 sats 97%   Last Vitals:  Vitals:   10/21/17 2007 10/21/17 2010  BP:    Pulse:  (!) 120  Resp:  15  Temp:  37.5 C  SpO2: 98% 99%    Last Pain:  Vitals:   10/21/17 2010  TempSrc: Oral  PainSc:          Complications: No apparent anesthesia complications

## 2017-10-21 NOTE — H&P (Signed)
Jennifer Brooks is a 27 y.o. G 3 P 1011 at 40 w 2 days presents in active labor. Previous C Section and has been consented and counseled about the risks of labor including maternal and fetal morbidity and mortality and risk of uterine rupture. She is not status post epidural. OB History    Gravida Para Term Preterm AB Living   3 1 1   1 1    SAB TAB Ectopic Multiple Live Births     1   0 1     Past Medical History:  Diagnosis Date  . Medical history non-contributory    Past Surgical History:  Procedure Laterality Date  . CESAREAN SECTION N/A 06/30/2015   Procedure: CESAREAN SECTION;  Surgeon: Gerald Leitzara Cole, MD;  Location: [facility];  Service: Obstetrics;  Laterality: N/A;   Family History: family history includes Heart disease in her maternal grandfather; Hypertension in her maternal grandfather, maternal grandmother, mother, and sister; Miscarriages / Stillbirths in her sister; Stomach cancer in her maternal aunt. Social History:  reports that  has never smoked. she has never used smokeless tobacco. She reports that she does not drink alcohol or use drugs.     Maternal Diabetes: No Genetic Screening: Normal Maternal Ultrasounds/Referrals: Normal Fetal Ultrasounds or other Referrals:  None Maternal Substance Abuse:  No Significant Maternal Medications:  None Significant Maternal Lab Results:  None Other Comments:  None  Review of Systems  All other systems reviewed and are negative.  Maternal Medical History:  Reason for admission: Contractions.     Dilation: 4.5 Effacement (%): 90 Station: Ballotable Exam by:: C Naval architectisher RN Blood pressure 132/88, pulse 94, temperature 98.4 F (36.9 C), temperature source Oral, resp. rate 20, height 5\' 3"  (1.6 m), weight 73.9 kg (163 lb), last menstrual period 01/12/2017, SpO2 100 %, unknown if currently breastfeeding. Maternal Exam:  Uterine Assessment: Contraction strength is moderate.  Contraction frequency is irregular.   Abdomen: Fetal  presentation: vertex     Physical Exam   Cervix is 90% 6 to 7 -2 Vertex AROM  Moderate clear fluid  Prenatal labs: ABO, Rh: --/--/B POS (03/11 0334) Antibody: NEG (03/11 0334) Rubella: Immune (08/01 0000) RPR: Nonreactive (08/01 0000)  HBsAg: Negative (08/01 0000)  HIV: Non-reactive (08/01 0000)  GBS: Negative (03/11 0000)   Assessment/Plan: IUP at 40 w 2 days [facility] Follow labor curve closely Will not augment with Pitocin right now because she has made change Recheck in 2 hours    Billi Bright L 10/21/2017, 7:42 AM

## 2017-10-21 NOTE — Anesthesia Postprocedure Evaluation (Signed)
Anesthesia Post Note  Patient: Jennifer Brooks  Procedure(s) Performed: CESAREAN SECTION (N/A )     Patient location during evaluation: PACU Anesthesia Type: Epidural Level of consciousness: awake and alert Pain management: pain level controlled Vital Signs Assessment: post-procedure vital signs reviewed and stable Respiratory status: spontaneous breathing and respiratory function stable Cardiovascular status: blood pressure returned to baseline and stable Postop Assessment: spinal receding Anesthetic complications: no    Last Vitals:  Vitals:   10/21/17 2052 10/21/17 2100  BP:  127/75  Pulse: (!) 104 (!) 102  Resp: 18 17  Temp:    SpO2: 99% 98%    Last Pain:  Vitals:   10/21/17 2041  TempSrc: Oral  PainSc:    Pain Goal:                 Remonia RichterSINGER,Sebastiano Luecke DANIEL

## 2017-10-21 NOTE — Progress Notes (Signed)
FHR now with decreased variability and decels  Cervix no change except still quite swollen Vaginal bleeding has increased Recommend Repeat LTCS Risks reviewed Consent signed

## 2017-10-21 NOTE — Brief Op Note (Signed)
10/21/2017  7:50 PM  PATIENT:  Jennifer Brooks  27 y.o. female  PRE-OPERATIVE DIAGNOSIS:   IUP at 40 w 2 days Previous C Section Failure to Progress  POST-OPERATIVE DIAGNOSIS:   Same  PROCEDURE:  Procedure(s): CESAREAN SECTION (N/A)  SURGEON:  Surgeon(s) and Role:    * Marcelle OverlieGrewal, Jamesa Tedrick, MD - Primary  PHYSICIAN ASSISTANT:   ASSISTANTS: none   ANESTHESIA:   epidural  EBL:  450 mL   BLOOD ADMINISTERED:none  DRAINS: Urinary Catheter (Foley)   LOCAL MEDICATIONS USED:  NONE  SPECIMEN:  No Specimen  DISPOSITION OF SPECIMEN:  N/A  COUNTS:  YES  TOURNIQUET:  * No tourniquets in log *  DICTATION: .Other Dictation: Dictation Number dictated  PLAN OF CARE: Admit to inpatient   PATIENT DISPOSITION:  PACU - hemodynamically stable.   Delay start of Pharmacological VTE agent (>24hrs) due to surgical blood loss or risk of bleeding: not applicable

## 2017-10-21 NOTE — Progress Notes (Signed)
Patient is currently comfortable with epidural.  She was offered pitocin for augmentation several times today but declined until no change documented around 2 pm.  Now on Pitocin IUPC reveals adequate MVUs Cervix is puffy 8 cm -2  Vertex is OT position  I had a very detailed discussed with patient that with current situation we may end up having to do a C Section. The parents are a little hesitant. Expectations for normal labor curve are discussed with the patient and her spouse. I agreed to recheck in 1 hour to see if change at that time as long as FHR remains Category 1.

## 2017-10-21 NOTE — Anesthesia Procedure Notes (Signed)
Epidural Patient location during procedure: OB Start time: 10/21/2017 4:52 AM  Staffing Anesthesiologist: Leonides GrillsEllender, Nathalee Smarr P, MD Performed: anesthesiologist   Preanesthetic Checklist Completed: patient identified, site marked, pre-op evaluation, timeout performed, IV checked, risks and benefits discussed and monitors and equipment checked  Epidural Patient position: sitting Prep: DuraPrep Patient monitoring: heart rate, cardiac monitor, continuous pulse ox and blood pressure Approach: midline Location: L4-L5 Injection technique: LOR air  Needle:  Needle type: Tuohy  Needle gauge: 17 G Needle length: 9 cm Needle insertion depth: 5 cm Catheter type: closed end flexible Catheter size: 19 Gauge Catheter at skin depth: 10 cm Test dose: negative, Other and 2% lidocaine with Epi 1:200 K  Assessment Events: blood not aspirated, injection not painful, no injection resistance and negative IV test  Additional Notes Informed consent obtained prior to proceeding including risk of failure, 1% risk of PDPH, risk of minor discomfort and bruising. Discussed alternatives to epidural analgesia and patient desires to proceed.  Timeout performed pre-procedure verifying patient name, procedure, and platelet count.  Patient tolerated procedure well. Reason for block:procedure for pain

## 2017-10-22 ENCOUNTER — Encounter (HOSPITAL_COMMUNITY): Payer: Self-pay | Admitting: Obstetrics and Gynecology

## 2017-10-22 LAB — CBC
HEMATOCRIT: 27.7 % — AB (ref 36.0–46.0)
HEMOGLOBIN: 9.5 g/dL — AB (ref 12.0–15.0)
MCH: 31.6 pg (ref 26.0–34.0)
MCHC: 34.3 g/dL (ref 30.0–36.0)
MCV: 92 fL (ref 78.0–100.0)
Platelets: 208 10*3/uL (ref 150–400)
RBC: 3.01 MIL/uL — AB (ref 3.87–5.11)
RDW: 12.9 % (ref 11.5–15.5)
WBC: 16.1 10*3/uL — AB (ref 4.0–10.5)

## 2017-10-22 NOTE — Addendum Note (Signed)
Addendum  created 10/22/17 0811 by Algis GreenhouseBurger, Barrie Wale A, CRNA   Sign clinical note

## 2017-10-22 NOTE — Addendum Note (Signed)
Addendum  created 10/22/17 0817 by Algis GreenhouseBurger, Rylie Knierim A, CRNA   Sign clinical note

## 2017-10-22 NOTE — Progress Notes (Signed)
Subjective: Postpartum Day 1: Cesarean Delivery Patient reports tolerating PO.    Objective: Vital signs in last 24 hours: Temp:  [98.2 F (36.8 C)-99.9 F (37.7 C)] 98.7 F (37.1 C) (03/12 0449) Pulse Rate:  [68-127] 93 (03/12 0501) Resp:  [15-22] 22 (03/12 0501) BP: (111-144)/(44-89) 117/57 (03/12 0501) SpO2:  [95 %-99 %] 97 % (03/12 0625)  Physical Exam:  General: alert, cooperative, appears stated age and no distress Lochia: appropriate Uterine Fundus: firm Incision: healing well DVT Evaluation: No evidence of DVT seen on physical exam.  Recent Labs    10/21/17 0334 10/22/17 0555  HGB 12.7 9.5*  HCT 37.3 27.7*    Assessment/Plan: Status post Cesarean section. Doing well postoperatively.  Continue current care Circ today.  Jennifer Brooks C 10/22/2017, 9:37 AM

## 2017-10-22 NOTE — Anesthesia Postprocedure Evaluation (Signed)
Anesthesia Post Note  Patient: Jennifer Brooks  Procedure(s) Performed: CESAREAN SECTION (N/A )     Patient location during evaluation: Mother Baby Anesthesia Type: Epidural Level of consciousness: awake Pain management: satisfactory to patient Vital Signs Assessment: post-procedure vital signs reviewed and stable Respiratory status: spontaneous breathing Cardiovascular status: stable Anesthetic complications: no    Last Vitals:  Vitals:   10/22/17 0501 10/22/17 0625  BP: (!) 117/57   Pulse: 93   Resp: (!) 22   Temp:    SpO2: 97% 97%    Last Pain:  Vitals:   10/22/17 0625  TempSrc:   PainSc: 0-No pain   Pain Goal:                 Magaby Rumberger     

## 2017-10-22 NOTE — Anesthesia Postprocedure Evaluation (Signed)
Anesthesia Post Note  Patient: Jennifer Brooks  Procedure(s) Performed: CESAREAN SECTION (N/A )     Patient location during evaluation: Mother Baby Anesthesia Type: Epidural Level of consciousness: awake Pain management: satisfactory to patient Vital Signs Assessment: post-procedure vital signs reviewed and stable Respiratory status: spontaneous breathing Cardiovascular status: stable Anesthetic complications: no    Last Vitals:  Vitals:   10/22/17 0501 10/22/17 0625  BP: (!) 117/57   Pulse: 93   Resp: (!) 22   Temp:    SpO2: 97% 97%    Last Pain:  Vitals:   10/22/17 0625  TempSrc:   PainSc: 0-No pain   Pain Goal:                 Jennifer Brooks,Jennifer Brooks

## 2017-10-22 NOTE — Lactation Note (Signed)
This note was copied from a baby's chart. Lactation Consultation Note  Patient Name: Boy Skeet LatchYoon Elem ZOXWR'UToday's Date: 10/22/2017 Reason for consult: Initial assessment;Term  First visit and mom had already had baby latched on since 1440 per RN.  Assessed latch and noted it to be a good latch.  Infant feeding on the right breast in the football position.  LC showed mom how to reposition pillow behind her back and place infant higher up on 2 pillows with body tucked in.  Mom stated that felt a little bit more comfortable.  Pamphlets given and mother informed of breastfeeding support group, outpatient services and breastfeeding resources.  [facility] Maternal Data    Feeding Feeding Type: Breast Fed Length of feed: (infant noted to be on breast since 1440 per RN)  LATCH Score Latch: Grasps breast easily, tongue down, lips flanged, rhythmical sucking.  Audible Swallowing: Spontaneous and intermittent  Type of Nipple: Everted at rest and after stimulation  Comfort (Breast/Nipple): Soft / non-tender  Hold (Positioning): Assistance needed to correctly position infant at breast and maintain latch.  LATCH Score: 9  Interventions Interventions: Breast feeding basics reviewed;Assisted with latch;Skin to skin;Breast massage;Position options;Support pillows;Adjust position  Lactation Tools Discussed/Used     Consult Status Consult Status: Follow-up Date: 10/23/17 Follow-up type: In-patient    Makari Portman R Elandra Powell 10/22/2017, 3:22 PM

## 2017-10-23 MED ORDER — OXYCODONE-ACETAMINOPHEN 5-325 MG PO TABS: 1.0000 | ORAL_TABLET | Freq: Four times a day (QID) | ORAL | 0 refills | Status: DC | PRN

## 2017-10-23 MED ORDER — DOCUSATE SODIUM 100 MG PO CAPS: 100.0000 mg | ORAL_CAPSULE | Freq: Two times a day (BID) | ORAL | 2 refills | Status: DC

## 2017-10-23 MED ORDER — FERROUS SULFATE 325 (65 FE) MG PO TBEC: 325.0000 mg | DELAYED_RELEASE_TABLET | Freq: Two times a day (BID) | ORAL | 2 refills | Status: DC

## 2017-10-23 NOTE — Op Note (Signed)
NAME:  Johann CapersNDERSON, Aidan               ACCOUNT NO.:  [id number]  MEDICAL RECORD NO.:  [id number]  LOCATION:  [facility]                          FACILITY:  [facility]  PHYSICIAN:  Rogen Porte L. Kentavius Dettore, M.D.DATE OF BIRTH:  30-Nov-1990  DATE OF PROCEDURE:  10/21/2017 DATE OF DISCHARGE:                              OPERATIVE REPORT   PREOPERATIVE DIAGNOSES: 1. IUP at term 2. Previous cesarean section. 3. Failure to progress.  POSTOPERATIVE DIAGNOSES: 1. IUP at term 2. Previous cesarean section. 3. Failure to progress.  PROCEDURE:  Low transverse cesarean section.  SURGEON:  Candia Kingsbury L. Vincente PoliGrewal, M.D.  ESTIMATED BLOOD LOSS:  Less than 500  DRAINS:  Foley catheter.  DESCRIPTION OF PROCEDURE:  The patient had been consented about the  risks associated with the procedure.  She has no concerns today. She was taken to the operating room and she was prepped and draped in sterile fashion.  Foley catheter had already been placed.  She was then tested with the Allis clamp and found to be numb.  A low-transverse incision was made and  Carried down to the fascia.  The fascia was scored in the midline and extended laterally.  The rectus muscles were separated in the midline.  The peritoneum was entered bluntly.  The peritoneal incision was then stretched.  The lower uterine segment was identified.  The bladder flap was created sharply and then digitally.  The bladder blade was then readjusted.  A low transverse incision was made in the uterus.  The uterus was entered using hemostat.  The baby was in OT position and rotated and delivered easily, was a female infant.  The cord was clamped and cut and the baby was handed to the waiting neonatal team. The placenta was manually removed, noted to be normal and intact with a 3 vessel cord.  The uterus was then cleared of all clots and debris.  It was closed in 2 layers using a 0 chromic suture.  Irrigation was performed.  The fascia and the peritoneum was closed using 0  Vicryl. The fascia was closed using 0 Vicryl starting each corner meeting in the midline.  After irrigation, subcutaneous layer was closed with interrupted using plain suture and the skin was closed with subcuticular after the keloid scar was removed with 3-0 Vicryl on a Keith needle. Benzoin, Steri-Strips and a honeycomb dressings were applied.  All sponge, lap, and instrument counts were correct x2.     Lyberti Thrush L. Vincente PoliGrewal, M.D.     Florestine AversMLG/MEDQ  D:  10/23/2017  T:  10/23/2017  Job:  [id number]

## 2017-10-23 NOTE — Discharge Summary (Signed)
Obstetric Discharge Summary Reason for Admission: onset of labor Prenatal Procedures: none Intrapartum Procedures: cesarean: low cervical, transverse - repeat, failed TOL Postpartum Procedures: none Complications-Operative and Postpartum: none Hemoglobin  Date Value Ref Range Status  10/22/2017 9.5 (L) 12.0 - 15.0 g/dL Final    Comment:    DELTA CHECK NOTED REPEATED TO VERIFY    HCT  Date Value Ref Range Status  10/22/2017 27.7 (L) 36.0 - 46.0 % Final    Physical Exam:  General: alert, cooperative and appears stated age 36Lochia: appropriate Uterine Fundus: firm Incision: healing well, no significant drainage, no dehiscence, no significant erythema DVT Evaluation: No evidence of DVT seen on physical exam. Negative Homan's sign. No cords or calf tenderness. No significant calf/ankle edema.  Discharge Diagnoses: Term Pregnancy-delivered  Discharge Information: Date: 10/23/2017 Activity: pelvic rest Diet: routine Medications: Colace, Iron and Percocet Condition: stable Instructions: refer to practice specific booklet Discharge to: home   Newborn Data: Live born female  Birth Weight: 7 lb 6 oz (3345 g) APGAR: 9, 9  Newborn Delivery   Birth date/time:  10/21/2017 19:15:00 Delivery type:  C-Section, Low Vertical C-section categorization:  Repeat     Home with mother.  Jennifer Brooks Jennifer Brooks 10/23/2017, 8:35 AM

## 2017-10-24 ENCOUNTER — Inpatient Hospital Stay (HOSPITAL_COMMUNITY): Payer: BLUE CROSS/BLUE SHIELD

## 2020-05-19 LAB — OB RESULTS CONSOLE ABO/RH: RH Type: POSITIVE

## 2020-05-19 LAB — OB RESULTS CONSOLE HEPATITIS B SURFACE ANTIGEN: Hepatitis B Surface Ag: NEGATIVE

## 2020-05-19 LAB — OB RESULTS CONSOLE HIV ANTIBODY (ROUTINE TESTING): HIV: NONREACTIVE

## 2020-05-19 LAB — OB RESULTS CONSOLE RUBELLA ANTIBODY, IGM: Rubella: IMMUNE

## 2020-05-19 LAB — OB RESULTS CONSOLE GC/CHLAMYDIA
Chlamydia: NEGATIVE
Gonorrhea: NEGATIVE

## 2020-05-19 LAB — OB RESULTS CONSOLE RPR: RPR: NONREACTIVE

## 2020-05-19 LAB — OB RESULTS CONSOLE ANTIBODY SCREEN: Antibody Screen: NEGATIVE

## 2020-11-28 NOTE — H&P (Signed)
Jennifer Brooks is a 30 y.o. female presenting for repeat cesarean section. History of two prior C sections after failed labor attempts. She declines a BTL and desires an IUD postpartum. She has a history of pp hypertension after pregnancy #2. She has been normotensive this pregnancy. She is expecting her third boy.    OB History    Gravida  3   Para  2   Term  2   Preterm      AB  1   Living  2     SAB      IAB  1   Ectopic      Multiple  0   Live Births  2          Past Medical History:  Diagnosis Date  . Medical history non-contributory    Past Surgical History:  Procedure Laterality Date  . CESAREAN SECTION N/A 06/30/2015   Procedure: CESAREAN SECTION;  Surgeon: Gerald Leitz, MD;  Location: [facility];  Service: Obstetrics;  Laterality: N/A;  . CESAREAN SECTION N/A 10/21/2017   Procedure: CESAREAN SECTION;  Surgeon: Marcelle Overlie, MD;  Location: [facility] BIRTHING SUITES;  Service: Obstetrics;  Laterality: N/A;   Family History: family history includes Heart disease in her maternal grandfather; Hypertension in her maternal grandfather, maternal grandmother, mother, and sister; Miscarriages / Stillbirths in her sister; Stomach cancer in her maternal aunt. Social History:  reports that she has never smoked. She has never used smokeless tobacco. She reports that she does not drink alcohol and does not use drugs.     Maternal Diabetes: No Genetic Screening: Normal Maternal Ultrasounds/Referrals: Isolated choroid plexus cyst - RESOLVED Fetal Ultrasounds or other Referrals:  None Maternal Substance Abuse:  No Significant Maternal Medications:  None Significant Maternal Lab Results:  None Other Comments:  None  Review of Systems History   unknown if currently breastfeeding. Exam Physical Exam  (from office) NAD, A&O NWOB Abd soft, nondistended, gravid  Prenatal labs: ABO, Rh:   Antibody:   Rubella:   RPR:    HBsAg:    HIV:    GBS:     Assessment/Plan:  29  presenting for scheduled repeatCS. Risks discussed including infection, bleeding, damage to surrounding structures, the need for additional procedures including hysterectomy, and the possibility of uterine rupture with neonatal morbidity/mortality, scarring, and abnormal placentation with subsequent pregnancies. Patient agrees to proceed. Gent/Clinda on call to OR. PCN allergy.     [name] 11/28/2020, 3:13 PM

## 2020-11-29 ENCOUNTER — Encounter (HOSPITAL_COMMUNITY): Payer: Self-pay | Admitting: *Deleted

## 2020-11-29 ENCOUNTER — Encounter (HOSPITAL_COMMUNITY): Payer: Self-pay

## 2020-11-29 NOTE — Patient Instructions (Signed)
Emylie Amster  11/29/2020   Your procedure is scheduled on:  12/12/2020  Arrive at 1100 at Entrance C on [address] at [facility]  and [employer]. You are invited to use the FREE valet parking or use the Visitor's parking deck.  Pick up the phone at the desk and dial [phone].  Call this number if you have problems the morning of surgery: [phone]  Remember:   Do not eat food:(After Midnight) Desps de medianoche.  Do not drink clear liquids: (After Midnight) Desps de medianoche.  Take these medicines the morning of surgery with A SIP OF WATER:  none   Do not wear jewelry, make-up or nail polish.  Do not wear lotions, powders, or perfumes. Do not wear deodorant.  Do not shave 48 hours prior to surgery.  Do not bring valuables to the hospital.  [facility] is not   responsible for any belongings or valuables brought to the hospital.  Contacts, dentures or bridgework may not be worn into surgery.  Leave suitcase in the car. After surgery it may be brought to your room.  For patients admitted to the hospital, checkout time is 11:00 AM the day of              discharge.      Please read over the following fact sheets that you were given:     Preparing for Surgery

## 2020-12-09 ENCOUNTER — Encounter (HOSPITAL_COMMUNITY)
Admission: RE | Admit: 2020-12-09 | Discharge: 2020-12-09 | Disposition: A | Discharge status: Home / Self Care | Payer: BC Managed Care – PPO | Source: Ambulatory Visit | Attending: Obstetrics and Gynecology | Admitting: Obstetrics and Gynecology

## 2020-12-09 ENCOUNTER — Other Ambulatory Visit (HOSPITAL_COMMUNITY)
Admission: RE | Admit: 2020-12-09 | Discharge: 2020-12-09 | Disposition: A | Discharge status: Home / Self Care | Payer: BC Managed Care – PPO | Source: Ambulatory Visit | Attending: Obstetrics and Gynecology | Admitting: Obstetrics and Gynecology

## 2020-12-09 ENCOUNTER — Other Ambulatory Visit: Payer: Self-pay

## 2020-12-09 DIAGNOSIS — Z20822 Contact with and (suspected) exposure to covid-19: Secondary | ICD-10-CM | POA: Insufficient documentation

## 2020-12-09 DIAGNOSIS — Z01812 Encounter for preprocedural laboratory examination: Secondary | ICD-10-CM | POA: Insufficient documentation

## 2020-12-09 LAB — SARS CORONAVIRUS 2 (TAT 6-24 HRS): SARS Coronavirus 2: NEGATIVE

## 2020-12-09 LAB — TYPE AND SCREEN
ABO/RH(D): B POS
Antibody Screen: NEGATIVE

## 2020-12-09 NOTE — Anesthesia Preprocedure Evaluation (Addendum)
Anesthesia Evaluation  Patient identified by MRN, date of birth, ID band Patient awake    Reviewed: Allergy & Precautions, NPO status , Patient's Chart, lab work & pertinent test results  Airway Mallampati: II  TM Distance: >3 FB Neck ROM: Full    Dental no notable dental hx. (+) Teeth Intact, Dental Advisory Given   Pulmonary neg pulmonary ROS,    Pulmonary exam normal breath sounds clear to auscultation       Cardiovascular negative cardio ROS Normal cardiovascular exam Rhythm:Regular Rate:Normal     Neuro/Psych negative neurological ROS  negative psych ROS   GI/Hepatic negative GI ROS, Neg liver ROS,   Endo/Other  negative endocrine ROS  Renal/GU negative Renal ROS  negative genitourinary   Musculoskeletal negative musculoskeletal ROS (+)   Abdominal   Peds negative pediatric ROS (+)  Hematology   Anesthesia Other Findings   Reproductive/Obstetrics (+) Pregnancy 2 prior sections: 2016, 2019 (both under epidural)                            Anesthesia Physical Anesthesia Plan  ASA: II  Anesthesia Plan: Spinal   Post-op Pain Management:    Induction:   PONV Risk Score and Plan: 3 and Ondansetron, Dexamethasone and Treatment may vary due to age or medical condition  Airway Management Planned: Natural Airway and Nasal Cannula  Additional Equipment: None  Intra-op Plan:   Post-operative Plan:   Informed Consent: I have reviewed the patients History and Physical, chart, labs and discussed the procedure including the risks, benefits and alternatives for the proposed anesthesia with the patient or authorized representative who has indicated his/her understanding and acceptance.       Plan Discussed with: CRNA  Anesthesia Plan Comments:        Anesthesia Quick Evaluation

## 2020-12-11 ENCOUNTER — Encounter (HOSPITAL_COMMUNITY): Payer: Self-pay | Admitting: Obstetrics and Gynecology

## 2020-12-12 ENCOUNTER — Inpatient Hospital Stay (HOSPITAL_COMMUNITY)
Admission: RE | Admit: 2020-12-12 | Discharge: 2020-12-14 | DRG: 788 | Disposition: A | Discharge status: Home / Self Care | Payer: BC Managed Care – PPO | Attending: Obstetrics and Gynecology | Admitting: Obstetrics and Gynecology

## 2020-12-12 ENCOUNTER — Encounter (HOSPITAL_COMMUNITY)
Admission: RE | Disposition: A | Discharge status: Home / Self Care | Payer: Self-pay | Source: Ambulatory Visit | Attending: Obstetrics and Gynecology

## 2020-12-12 ENCOUNTER — Inpatient Hospital Stay (HOSPITAL_COMMUNITY): Payer: BC Managed Care – PPO | Admitting: Anesthesiology

## 2020-12-12 ENCOUNTER — Encounter (HOSPITAL_COMMUNITY): Payer: Self-pay | Admitting: Obstetrics and Gynecology

## 2020-12-12 ENCOUNTER — Other Ambulatory Visit: Payer: Self-pay

## 2020-12-12 DIAGNOSIS — Z3490 Encounter for supervision of normal pregnancy, unspecified, unspecified trimester: Secondary | ICD-10-CM

## 2020-12-12 DIAGNOSIS — O34211 Maternal care for low transverse scar from previous cesarean delivery: Secondary | ICD-10-CM | POA: Diagnosis present

## 2020-12-12 DIAGNOSIS — Z880 Allergy status to penicillin: Secondary | ICD-10-CM

## 2020-12-12 DIAGNOSIS — Z20822 Contact with and (suspected) exposure to covid-19: Secondary | ICD-10-CM | POA: Diagnosis present

## 2020-12-12 LAB — CBC
HCT: 36.1 % (ref 36.0–46.0)
Hemoglobin: 12.2 g/dL (ref 12.0–15.0)
MCH: 31.4 pg (ref 26.0–34.0)
MCHC: 33.8 g/dL (ref 30.0–36.0)
MCV: 93 fL (ref 80.0–100.0)
Platelets: 193 10*3/uL (ref 150–400)
RBC: 3.88 MIL/uL (ref 3.87–5.11)
RDW: 12.4 % (ref 11.5–15.5)
WBC: 8.8 10*3/uL (ref 4.0–10.5)
nRBC: 0 % (ref 0.0–0.2)

## 2020-12-12 SURGERY — Surgical Case
Anesthesia: Spinal

## 2020-12-12 MED ORDER — WITCH HAZEL-GLYCERIN EX PADS: 1.0000 "application " | MEDICATED_PAD | CUTANEOUS | Status: DC | PRN

## 2020-12-12 MED ORDER — KETOROLAC TROMETHAMINE 30 MG/ML IJ SOLN
30.0000 mg | Freq: Four times a day (QID) | INTRAMUSCULAR | Status: AC
  Administered 2020-12-13 (×2): 30 mg via INTRAVENOUS
  Filled 2020-12-12 (×3): qty 1

## 2020-12-12 MED ORDER — KETOROLAC TROMETHAMINE 30 MG/ML IJ SOLN
INTRAMUSCULAR | Status: AC
  Filled 2020-12-12: qty 1

## 2020-12-12 MED ORDER — CLINDAMYCIN PHOSPHATE 900 MG/50ML IV SOLN
INTRAVENOUS | Status: AC
  Filled 2020-12-12: qty 50

## 2020-12-12 MED ORDER — KETOROLAC TROMETHAMINE 30 MG/ML IJ SOLN
30.0000 mg | Freq: Once | INTRAMUSCULAR | Status: AC | PRN
  Administered 2020-12-12: 30 mg via INTRAVENOUS

## 2020-12-12 MED ORDER — COCONUT OIL OIL: 1.0000 "application " | TOPICAL_OIL | Status: DC | PRN

## 2020-12-12 MED ORDER — PRENATAL MULTIVITAMIN CH
1.0000 | ORAL_TABLET | Freq: Every day | ORAL | Status: DC
  Administered 2020-12-13: 1 via ORAL
  Filled 2020-12-12: qty 1

## 2020-12-12 MED ORDER — ACETAMINOPHEN 500 MG PO TABS: 1000.0000 mg | ORAL_TABLET | Freq: Four times a day (QID) | ORAL | Status: DC

## 2020-12-12 MED ORDER — PRENATAL MULTIVITAMIN CH: 1.0000 | ORAL_TABLET | Freq: Every day | ORAL | Status: DC

## 2020-12-12 MED ORDER — FENTANYL CITRATE (PF) 100 MCG/2ML IJ SOLN
INTRAMUSCULAR | Status: DC | PRN
  Administered 2020-12-12: 15 ug via INTRATHECAL

## 2020-12-12 MED ORDER — PHENYLEPHRINE HCL-NACL 20-0.9 MG/250ML-% IV SOLN
INTRAVENOUS | Status: DC | PRN
  Administered 2020-12-12: 60 ug/min via INTRAVENOUS

## 2020-12-12 MED ORDER — PHENYLEPHRINE HCL-NACL 20-0.9 MG/250ML-% IV SOLN
INTRAVENOUS | Status: AC
  Filled 2020-12-12: qty 250

## 2020-12-12 MED ORDER — KETOROLAC TROMETHAMINE 30 MG/ML IJ SOLN
30.0000 mg | Freq: Four times a day (QID) | INTRAMUSCULAR | Status: AC | PRN
  Administered 2020-12-12: 30 mg via INTRAVENOUS

## 2020-12-12 MED ORDER — ONDANSETRON HCL 4 MG/2ML IJ SOLN: 4.0000 mg | Freq: Three times a day (TID) | INTRAMUSCULAR | Status: DC | PRN

## 2020-12-12 MED ORDER — OXYCODONE HCL 5 MG PO TABS
5.0000 mg | ORAL_TABLET | ORAL | Status: DC | PRN
  Administered 2020-12-13: 5 mg via ORAL
  Administered 2020-12-14 (×2): 10 mg via ORAL
  Filled 2020-12-12: qty 2
  Filled 2020-12-12: qty 1
  Filled 2020-12-12: qty 2

## 2020-12-12 MED ORDER — DIBUCAINE (PERIANAL) 1 % EX OINT: 1.0000 "application " | TOPICAL_OINTMENT | CUTANEOUS | Status: DC | PRN

## 2020-12-12 MED ORDER — HYDROMORPHONE HCL 1 MG/ML IJ SOLN: 0.2000 mg | INTRAMUSCULAR | Status: DC | PRN

## 2020-12-12 MED ORDER — ACETAMINOPHEN 500 MG PO TABS
1000.0000 mg | ORAL_TABLET | Freq: Four times a day (QID) | ORAL | Status: DC
  Administered 2020-12-12 – 2020-12-14 (×7): 1000 mg via ORAL
  Filled 2020-12-12 (×7): qty 2

## 2020-12-12 MED ORDER — IBUPROFEN 600 MG PO TABS
600.0000 mg | ORAL_TABLET | Freq: Four times a day (QID) | ORAL | Status: DC
  Administered 2020-12-13 – 2020-12-14 (×2): 600 mg via ORAL
  Filled 2020-12-12 (×3): qty 1

## 2020-12-12 MED ORDER — DIPHENHYDRAMINE HCL 25 MG PO CAPS: 25.0000 mg | ORAL_CAPSULE | Freq: Four times a day (QID) | ORAL | Status: DC | PRN

## 2020-12-12 MED ORDER — MEPERIDINE HCL 25 MG/ML IJ SOLN: 6.2500 mg | INTRAMUSCULAR | Status: DC | PRN

## 2020-12-12 MED ORDER — BENZOCAINE-MENTHOL 20-0.5 % EX AERO: 1.0000 "application " | INHALATION_SPRAY | CUTANEOUS | Status: DC | PRN

## 2020-12-12 MED ORDER — OXYCODONE HCL 5 MG PO TABS: 5.0000 mg | ORAL_TABLET | Freq: Once | ORAL | Status: DC | PRN

## 2020-12-12 MED ORDER — SIMETHICONE 80 MG PO CHEW: 80.0000 mg | CHEWABLE_TABLET | ORAL | Status: DC | PRN

## 2020-12-12 MED ORDER — SODIUM CHLORIDE 0.9 % IR SOLN
Status: DC | PRN
  Administered 2020-12-12: 1

## 2020-12-12 MED ORDER — NALBUPHINE HCL 10 MG/ML IJ SOLN
5.0000 mg | Freq: Once | INTRAMUSCULAR | Status: DC | PRN
Start: 2020-12-12 — End: 2020-12-14

## 2020-12-12 MED ORDER — TETANUS-DIPHTH-ACELL PERTUSSIS 5-2.5-18.5 LF-MCG/0.5 IM SUSY: 0.5000 mL | PREFILLED_SYRINGE | Freq: Once | INTRAMUSCULAR | Status: DC

## 2020-12-12 MED ORDER — LACTATED RINGERS IV SOLN: INTRAVENOUS | Status: DC

## 2020-12-12 MED ORDER — SENNOSIDES-DOCUSATE SODIUM 8.6-50 MG PO TABS
2.0000 | ORAL_TABLET | Freq: Every day | ORAL | Status: DC
  Administered 2020-12-13 – 2020-12-14 (×2): 2 via ORAL
  Filled 2020-12-12 (×2): qty 2

## 2020-12-12 MED ORDER — SOD CITRATE-CITRIC ACID 500-334 MG/5ML PO SOLN
ORAL | Status: AC
  Filled 2020-12-12: qty 30

## 2020-12-12 MED ORDER — PROMETHAZINE HCL 25 MG/ML IJ SOLN: 6.2500 mg | INTRAMUSCULAR | Status: DC | PRN

## 2020-12-12 MED ORDER — NALOXONE HCL 0.4 MG/ML IJ SOLN: 0.4000 mg | INTRAMUSCULAR | Status: DC | PRN

## 2020-12-12 MED ORDER — OXYTOCIN-SODIUM CHLORIDE 30-0.9 UT/500ML-% IV SOLN
2.5000 [IU]/h | INTRAVENOUS | Status: AC
  Administered 2020-12-12: 2.5 [IU]/h via INTRAVENOUS
  Filled 2020-12-12: qty 500

## 2020-12-12 MED ORDER — GENTAMICIN SULFATE 40 MG/ML IJ SOLN
5.0000 mg/kg | INTRAVENOUS | Status: AC
  Administered 2020-12-12: 306 mg via INTRAVENOUS
  Filled 2020-12-12: qty 7.75

## 2020-12-12 MED ORDER — SOD CITRATE-CITRIC ACID 500-334 MG/5ML PO SOLN
30.0000 mL | ORAL | Status: AC
  Administered 2020-12-12: 30 mL via ORAL

## 2020-12-12 MED ORDER — NALBUPHINE HCL 10 MG/ML IJ SOLN: 5.0000 mg | Freq: Once | INTRAMUSCULAR | Status: DC | PRN

## 2020-12-12 MED ORDER — ACETAMINOPHEN 325 MG PO TABS: 650.0000 mg | ORAL_TABLET | ORAL | Status: DC | PRN

## 2020-12-12 MED ORDER — OXYCODONE HCL 5 MG PO TABS: 10.0000 mg | ORAL_TABLET | ORAL | Status: DC | PRN

## 2020-12-12 MED ORDER — SENNOSIDES-DOCUSATE SODIUM 8.6-50 MG PO TABS: 2.0000 | ORAL_TABLET | ORAL | Status: DC

## 2020-12-12 MED ORDER — HYDROMORPHONE HCL 1 MG/ML IJ SOLN: 0.2500 mg | INTRAMUSCULAR | Status: DC | PRN

## 2020-12-12 MED ORDER — OXYCODONE HCL 5 MG PO TABS: 5.0000 mg | ORAL_TABLET | ORAL | Status: DC | PRN

## 2020-12-12 MED ORDER — BUPIVACAINE IN DEXTROSE 0.75-8.25 % IT SOLN
INTRATHECAL | Status: DC | PRN
  Administered 2020-12-12: 1.5 mL via INTRATHECAL

## 2020-12-12 MED ORDER — SCOPOLAMINE 1 MG/3DAYS TD PT72
1.0000 | MEDICATED_PATCH | Freq: Once | TRANSDERMAL | Status: DC
  Administered 2020-12-12: 1.5 mg via TRANSDERMAL

## 2020-12-12 MED ORDER — MENTHOL 3 MG MT LOZG: 1.0000 | LOZENGE | OROMUCOSAL | Status: DC | PRN

## 2020-12-12 MED ORDER — ZOLPIDEM TARTRATE 5 MG PO TABS: 5.0000 mg | ORAL_TABLET | Freq: Every evening | ORAL | Status: DC | PRN

## 2020-12-12 MED ORDER — IBUPROFEN 600 MG PO TABS: 600.0000 mg | ORAL_TABLET | Freq: Four times a day (QID) | ORAL | Status: DC

## 2020-12-12 MED ORDER — ONDANSETRON HCL 4 MG/2ML IJ SOLN
INTRAMUSCULAR | Status: AC
  Filled 2020-12-12: qty 2

## 2020-12-12 MED ORDER — DEXAMETHASONE SODIUM PHOSPHATE 10 MG/ML IJ SOLN
INTRAMUSCULAR | Status: DC | PRN
  Administered 2020-12-12: 10 mg via INTRAVENOUS

## 2020-12-12 MED ORDER — SIMETHICONE 80 MG PO CHEW
80.0000 mg | CHEWABLE_TABLET | ORAL | Status: DC | PRN
  Administered 2020-12-14: 80 mg via ORAL
  Filled 2020-12-12: qty 1

## 2020-12-12 MED ORDER — MORPHINE SULFATE (PF) 0.5 MG/ML IJ SOLN
INTRAMUSCULAR | Status: DC | PRN
  Administered 2020-12-12: .15 mg via INTRATHECAL

## 2020-12-12 MED ORDER — FENTANYL CITRATE (PF) 100 MCG/2ML IJ SOLN
INTRAMUSCULAR | Status: AC
  Filled 2020-12-12: qty 2

## 2020-12-12 MED ORDER — OXYCODONE HCL 5 MG/5ML PO SOLN
5.0000 mg | Freq: Once | ORAL | Status: DC | PRN
Start: 2020-12-12 — End: 2020-12-12

## 2020-12-12 MED ORDER — ONDANSETRON HCL 4 MG PO TABS: 4.0000 mg | ORAL_TABLET | ORAL | Status: DC | PRN

## 2020-12-12 MED ORDER — DIPHENHYDRAMINE HCL 25 MG PO CAPS
25.0000 mg | ORAL_CAPSULE | Freq: Four times a day (QID) | ORAL | Status: DC | PRN
Start: 2020-12-12 — End: 2020-12-14

## 2020-12-12 MED ORDER — SCOPOLAMINE 1 MG/3DAYS TD PT72
MEDICATED_PATCH | TRANSDERMAL | Status: AC
  Filled 2020-12-12: qty 1

## 2020-12-12 MED ORDER — KETOROLAC TROMETHAMINE 30 MG/ML IJ SOLN: 30.0000 mg | Freq: Four times a day (QID) | INTRAMUSCULAR | Status: AC | PRN

## 2020-12-12 MED ORDER — SODIUM CHLORIDE 0.9% FLUSH
3.0000 mL | INTRAVENOUS | Status: DC | PRN
  Administered 2020-12-13: 3 mL via INTRAVENOUS

## 2020-12-12 MED ORDER — SIMETHICONE 80 MG PO CHEW
80.0000 mg | CHEWABLE_TABLET | Freq: Three times a day (TID) | ORAL | Status: DC
  Administered 2020-12-12 – 2020-12-14 (×4): 80 mg via ORAL
  Filled 2020-12-12 (×4): qty 1

## 2020-12-12 MED ORDER — DIPHENHYDRAMINE HCL 50 MG/ML IJ SOLN: 12.5000 mg | INTRAMUSCULAR | Status: DC | PRN

## 2020-12-12 MED ORDER — OXYTOCIN-SODIUM CHLORIDE 30-0.9 UT/500ML-% IV SOLN
INTRAVENOUS | Status: AC
  Filled 2020-12-12: qty 500

## 2020-12-12 MED ORDER — MORPHINE SULFATE (PF) 0.5 MG/ML IJ SOLN
INTRAMUSCULAR | Status: AC
  Filled 2020-12-12: qty 10

## 2020-12-12 MED ORDER — ZOLPIDEM TARTRATE 5 MG PO TABS
5.0000 mg | ORAL_TABLET | Freq: Every evening | ORAL | Status: DC | PRN
Start: 2020-12-12 — End: 2020-12-14

## 2020-12-12 MED ORDER — CLINDAMYCIN PHOSPHATE 900 MG/50ML IV SOLN
900.0000 mg | INTRAVENOUS | Status: AC
  Administered 2020-12-12: 900 mg via INTRAVENOUS

## 2020-12-12 MED ORDER — DEXAMETHASONE SODIUM PHOSPHATE 10 MG/ML IJ SOLN
INTRAMUSCULAR | Status: AC
  Filled 2020-12-12: qty 1

## 2020-12-12 MED ORDER — ONDANSETRON HCL 4 MG/2ML IJ SOLN
INTRAMUSCULAR | Status: DC | PRN
  Administered 2020-12-12: 4 mg via INTRAVENOUS

## 2020-12-12 MED ORDER — NALBUPHINE HCL 10 MG/ML IJ SOLN: 5.0000 mg | INTRAMUSCULAR | Status: DC | PRN

## 2020-12-12 MED ORDER — OXYTOCIN-SODIUM CHLORIDE 30-0.9 UT/500ML-% IV SOLN
INTRAVENOUS | Status: DC | PRN
  Administered 2020-12-12: 300 mL via INTRAVENOUS
  Administered 2020-12-12: 100 mL via INTRAVENOUS

## 2020-12-12 MED ORDER — NALOXONE HCL 4 MG/10ML IJ SOLN
1.0000 ug/kg/h | INTRAVENOUS | Status: DC | PRN
  Filled 2020-12-12: qty 5

## 2020-12-12 MED ORDER — DIPHENHYDRAMINE HCL 25 MG PO CAPS: 25.0000 mg | ORAL_CAPSULE | ORAL | Status: DC | PRN

## 2020-12-12 MED ORDER — ONDANSETRON HCL 4 MG/2ML IJ SOLN: 4.0000 mg | INTRAMUSCULAR | Status: DC | PRN

## 2020-12-12 SURGICAL SUPPLY — 35 items
BENZOIN TINCTURE PRP APPL 2/3 (GAUZE/BANDAGES/DRESSINGS) ×2 IMPLANT
CHLORAPREP W/TINT 26ML (MISCELLANEOUS) ×2 IMPLANT
CLAMP CORD UMBIL (MISCELLANEOUS) IMPLANT
CLOSURE STERI-STRIP 1/4X4 (GAUZE/BANDAGES/DRESSINGS) ×2 IMPLANT
CLOTH BEACON ORANGE TIMEOUT ST (SAFETY) ×2 IMPLANT
CLSR STERI-STRIP ANTIMIC 1/2X4 (GAUZE/BANDAGES/DRESSINGS) ×2 IMPLANT
DRSG OPSITE POSTOP 4X10 (GAUZE/BANDAGES/DRESSINGS) ×2 IMPLANT
ELECT REM PT RETURN 9FT ADLT (ELECTROSURGICAL) ×2
ELECTRODE REM PT RTRN 9FT ADLT (ELECTROSURGICAL) ×1 IMPLANT
EXTRACTOR VACUUM KIWI (MISCELLANEOUS) ×2 IMPLANT
GAUZE SPONGE 4X4 12PLY STRL LF (GAUZE/BANDAGES/DRESSINGS) ×4 IMPLANT
GLOVE BIO SURGEON STRL SZ 6.5 (GLOVE) ×2 IMPLANT
GLOVE BIOGEL PI IND STRL 6.5 (GLOVE) ×1 IMPLANT
GLOVE BIOGEL PI IND STRL 7.0 (GLOVE) ×2 IMPLANT
GLOVE BIOGEL PI INDICATOR 6.5 (GLOVE) ×1
GLOVE BIOGEL PI INDICATOR 7.0 (GLOVE) ×2
GOWN STRL REUS W/TWL LRG LVL3 (GOWN DISPOSABLE) ×4 IMPLANT
KIT ABG SYR 3ML LUER SLIP (SYRINGE) ×2 IMPLANT
NEEDLE HYPO 25X5/8 SAFETYGLIDE (NEEDLE) ×2 IMPLANT
NS IRRIG 1000ML POUR BTL (IV SOLUTION) ×2 IMPLANT
PACK C SECTION WH (CUSTOM PROCEDURE TRAY) ×2 IMPLANT
PAD ABD 7.5X8 STRL (GAUZE/BANDAGES/DRESSINGS) ×2 IMPLANT
PAD OB MATERNITY 4.3X12.25 (PERSONAL CARE ITEMS) ×2 IMPLANT
PENCIL SMOKE EVAC W/HOLSTER (ELECTROSURGICAL) ×2 IMPLANT
SPONGE GAUZE 4X4 12PLY STER LF (GAUZE/BANDAGES/DRESSINGS) ×4 IMPLANT
SUT PLAIN 0 NONE (SUTURE) IMPLANT
SUT PLAIN 2 0 (SUTURE) ×1
SUT PLAIN ABS 2-0 CT1 27XMFL (SUTURE) ×1 IMPLANT
SUT VIC AB 0 CT1 36 (SUTURE) ×2 IMPLANT
SUT VIC AB 0 CTX 36 (SUTURE) ×2
SUT VIC AB 0 CTX36XBRD ANBCTRL (SUTURE) ×2 IMPLANT
SUT VIC AB 4-0 PS2 27 (SUTURE) ×2 IMPLANT
TOWEL OR 17X24 6PK STRL BLUE (TOWEL DISPOSABLE) ×2 IMPLANT
TRAY FOLEY W/BAG SLVR 14FR LF (SET/KITS/TRAYS/PACK) IMPLANT
WATER STERILE IRR 1000ML POUR (IV SOLUTION) ×2 IMPLANT

## 2020-12-12 NOTE — Transfer of Care (Signed)
Immediate Anesthesia Transfer of Care Note  Patient: Jennifer Brooks  Procedure(s) Performed: REPEAT CESAREAN SECTION EDC: 12-18-20 ALLERGIES: PENICILLINS (N/A )  Patient Location: PACU  Anesthesia Type:Spinal  Level of Consciousness: awake  Airway & Oxygen Therapy: Patient Spontanous Breathing  Post-op Assessment: Report given to RN  Post vital signs: Reviewed and stable  Last Vitals:  Vitals Value Taken Time  BP 117/80 12/12/20 1402  Temp    Pulse 70 12/12/20 1404  Resp 18 12/12/20 1404  SpO2 99 % 12/12/20 1404  Vitals shown include unvalidated device data.  Last Pain:  Vitals:   12/12/20 1120  TempSrc: Oral  PainSc: 0-No pain      Patients Stated Pain Goal: 4 (12/12/20 1120)  Complications: No complications documented.

## 2020-12-12 NOTE — Progress Notes (Addendum)
No updates to above H&P. Patient arrived NPO and was consented in PACU. Risks again discussed, all questions answered, and consent signed. Proceed with above surgery. Expecting a third boy - "Azaire"!!     Belva Agee MD

## 2020-12-12 NOTE — Anesthesia Procedure Notes (Signed)
Spinal  Patient location during procedure: OR Start time: 12/12/2020 12:53 PM End time: 12/12/2020 12:56 PM Reason for block: surgical anesthesia Staffing Performed: anesthesiologist  Anesthesiologist: Lannie Fields, DO Preanesthetic Checklist Completed: patient identified, IV checked, risks and benefits discussed, surgical consent, monitors and equipment checked, pre-op evaluation and timeout performed Spinal Block Patient position: sitting Prep: DuraPrep and site prepped and draped Patient monitoring: cardiac monitor, continuous pulse ox and blood pressure Approach: midline Location: L3-4 Injection technique: single-shot Needle Needle type: Pencan  Needle gauge: 24 G Needle length: 9 cm Assessment Sensory level: T6 Events: CSF return Additional Notes Functioning IV was confirmed and monitors were applied. Sterile prep and drape, including hand hygiene and sterile gloves were used. The patient was positioned and the spine was prepped. The skin was anesthetized with lidocaine.  Free flow of clear CSF was obtained prior to injecting local anesthetic into the CSF.  The spinal needle aspirated freely following injection.  The needle was carefully withdrawn.  The patient tolerated the procedure well.

## 2020-12-12 NOTE — Op Note (Signed)
PROCEDURE DATE: 12/12/20   PREOPERATIVE DIAGNOSIS: repeat cesarean section   POSTOPERATIVE DIAGNOSIS: The same   PROCEDURE:    Repeat Low Transverse Cesarean Section   SURGEON:  Dr. Belva Agee   INDICATIONS: This is a 02VO [id number] at 39.1 wga requiring cesarean section secondary to desires repeat CS. This was her third cesarean section.  Decision made to proceed with LTCS. The risks of cesarean section discussed with the patient included but were not limited to: bleeding which may require transfusion or reoperation; infection which may require antibiotics; injury to bowel, bladder, ureters or other surrounding organs; injury to the fetus; need for additional procedures including hysterectomy in the event of a life-threatening hemorrhage; placental abnormalities wth subsequent pregnancies, incisional problems, thromboembolic phenomenon and other postoperative/anesthesia complications. The patient agreed with the proposed plan, giving informed consent for the procedure.     FINDINGS:  Viable female infant in vertex presentation, APGARs pending,  Weight pending, Amniotic fluid cler,  Intact placenta, three vessel cord.  Grossly normal uterus except for some moderate adhesive disease with bladder tacked slightly above LUS .   ANESTHESIA:    Epidural ESTIMATED BLOOD LOSS: 188cc SPECIMENS: Placenta for routine COMPLICATIONS: None immediate   PROCEDURE IN DETAIL:  The patient received intravenous antibiotics (2g Ancef) and had sequential compression devices applied to her lower extremities while in the preoperative area.  She was then taken to the operating room where epidural anesthesia was dosed up to surgical level and was found to be adequate. She was then placed in a dorsal supine position with a leftward tilt, and prepped and draped in a sterile manner.  A foley catheter was placed into her bladder and attached to constant gravity.  After an adequate timeout was performed, a Pfannenstiel skin  incision was made with scalpel and carried through to the underlying layer of fascia. The fascia was incised in the midline and this incision was extended bilaterally using the Mayo scissors. Kocher clamps were applied to the superior aspect of the fascial incision and the underlying rectus muscles were dissected off bluntly. A similar process was carried out on the inferior aspect of the facial incision. The rectus muscles were separated in the midline bluntly and the peritoneum was entered bluntly.  A bladder flap was created sharply with moderate adhesive disease noted and developed bluntly. A transverse hysterotomy was made with a scalpel and extended bilaterally bluntly. The bladder blade was then removed. The infant was successfully delivered, and cord was clamped and cut and infant was handed over to awaiting neonatology team. Uterine massage was then administered and the placenta delivered intact with three-vessel cord. Cord gases were taken. The uterus was cleared of clot and debris.  The hysterotomy was closed with 0 vicryl.  A second imbricating suture of 0-vicryl was used to reinforce the incision and aid in hemostasis.The fascia was closed with 0-Vicryl in a running fashion with good restoration of anatomy.  The subcutaneus tissue was irrigated and was reapproximated using three interrupted plain gut stitches.  Some prior keloid was cut out.The skin was closed with 4-0 Vicryl in a subcuticular fashion.  All surgical site and was hemostatic at end of procedure) without any further bleeding on exam.   It's their third boy - "Azaire"!!    Pt tolerated the procedure well. All sponge/lap/needle counts were correct  X 2. Pt taken to recovery room in stable condition.     Belva Agee MD

## 2020-12-12 NOTE — Anesthesia Postprocedure Evaluation (Signed)
Anesthesia Post Note  Patient: Jennifer Brooks  Procedure(s) Performed: REPEAT CESAREAN SECTION EDC: 12-18-20 ALLERGIES: PENICILLINS (N/A )     Patient location during evaluation: PACU Anesthesia Type: Spinal Level of consciousness: awake and alert and oriented Pain management: pain level controlled Vital Signs Assessment: post-procedure vital signs reviewed and stable Respiratory status: spontaneous breathing, nonlabored ventilation and respiratory function stable Cardiovascular status: blood pressure returned to baseline and stable Postop Assessment: no headache, no backache, spinal receding, patient able to bend at knees and no apparent nausea or vomiting Anesthetic complications: no   No complications documented.  Last Vitals:  Vitals:   12/12/20 1500 12/12/20 1515  BP: 115/77 113/77  Pulse: 65 71  Resp: 18 13  Temp: 36.6 C   SpO2: 99% 97%    Last Pain:  Vitals:   12/12/20 1515  TempSrc:   PainSc: 0-No pain    LLE Motor Response: Purposeful movement (12/12/20 1515) LLE Sensation: Tingling (12/12/20 1515) RLE Motor Response: Purposeful movement (12/12/20 1515) RLE Sensation: Tingling (12/12/20 1515)      Lannie Fields

## 2020-12-13 LAB — CBC
HCT: 29.4 % — ABNORMAL LOW (ref 36.0–46.0)
Hemoglobin: 9.9 g/dL — ABNORMAL LOW (ref 12.0–15.0)
MCH: 31.7 pg (ref 26.0–34.0)
MCHC: 33.7 g/dL (ref 30.0–36.0)
MCV: 94.2 fL (ref 80.0–100.0)
Platelets: 172 10*3/uL (ref 150–400)
RBC: 3.12 MIL/uL — ABNORMAL LOW (ref 3.87–5.11)
RDW: 12.5 % (ref 11.5–15.5)
WBC: 15.2 10*3/uL — ABNORMAL HIGH (ref 4.0–10.5)
nRBC: 0 % (ref 0.0–0.2)

## 2020-12-13 LAB — BIRTH TISSUE RECOVERY COLLECTION (PLACENTA DONATION)

## 2020-12-13 NOTE — Progress Notes (Signed)
Subjective: Postpartum Day 1: Cesarean Delivery Patient reports tolerating PO, + flatus and no problems voiding.  Desires circ.  Objective: Vital signs in last 24 hours: Temp:  [97.6 F (36.4 C)-99 F (37.2 C)] 98.2 F (36.8 C) (05/03 0445) Pulse Rate:  [16-87] 52 (05/03 0445) Resp:  [13-18] 18 (05/03 0445) BP: (105-144)/(58-97) 105/58 (05/03 0445) SpO2:  [95 %-100 %] 96 % (05/02 2052) Weight:  [74.4 kg] 74.4 kg (05/02 1120)  Physical Exam:  General: alert, cooperative and appears stated age 30: appropriate Uterine Fundus: firm Incision: pressure dressing removed and honeycomb dressing saturated with old blood DVT Evaluation: No evidence of DVT seen on physical exam. Negative Homan's sign. No cords or calf tenderness.  Recent Labs    12/12/20 1213 12/13/20 0504  HGB 12.2 9.9*  HCT 36.1 29.4*    Assessment/Plan: Status post Cesarean section. Doing well postoperatively.  Continue current care. Replace honeycomb dressing Patient is counseled for circ including risk of bleeding, infection and scarring.  All questions were answered and will proceed.  Mitchel Honour 12/13/2020, 9:54 AM

## 2020-12-14 ENCOUNTER — Encounter (HOSPITAL_COMMUNITY): Payer: Self-pay | Admitting: Obstetrics and Gynecology

## 2020-12-14 MED ORDER — ACETAMINOPHEN 500 MG PO TABS: 1000.0000 mg | ORAL_TABLET | Freq: Four times a day (QID) | ORAL | 0 refills | Status: AC

## 2020-12-14 MED ORDER — OXYCODONE HCL 5 MG PO TABS: 5.0000 mg | ORAL_TABLET | Freq: Four times a day (QID) | ORAL | 0 refills | Status: AC | PRN

## 2020-12-14 MED ORDER — IBUPROFEN 600 MG PO TABS: 600.0000 mg | ORAL_TABLET | Freq: Four times a day (QID) | ORAL | 0 refills | Status: AC | PRN

## 2020-12-14 NOTE — Discharge Summary (Signed)
Postpartum Discharge Summary  Date of Service updated 12/14/20     Patient Name: Jennifer Brooks DOB: 07/06/1991 MRN: [id number]  Date of admission: 12/12/2020 Delivery date:12/12/2020  Delivering provider: Tyson Dense  Date of discharge: 12/14/2020  Admitting diagnosis: Pregnancy [Z34.90] Intrauterine pregnancy: [redacted]w[redacted]d    Secondary diagnosis:  Active Problems:   Pregnancy  Additional problems:     Discharge diagnosis: Term Pregnancy Delivered                                              Post partum procedures: Augmentation: N/A Complications: None  Hospital course: Sceduled C/S   30y.o. yo [id number] admitted to the hospital 12/12/2020 for scheduled cesarean section with the following indication:Elective Repeat.Delivery details are as follows:  Membrane Rupture Time/Date: 1:20 PM ,12/12/2020   Delivery Method:C-Section, Low Transverse  Details of operation can be found in separate operative note.  Patient had an uncomplicated postpartum course.  She is ambulating, tolerating a regular diet, passing flatus, and urinating well. Patient is discharged home in stable condition on  12/14/20        Newborn Data: Birth date:12/12/2020  Birth time:1:22 PM  Gender:Female  Living status:Living  Apgars:9 ,9  Weight:3220 g     Magnesium Sulfate received: No BMZ received: No Rhophylac:No MMR:No T-DaP:Given prenatally Flu: No Transfusion:No  Physical exam  Vitals:   12/13/20 0445 12/13/20 1430 12/13/20 2027 12/14/20 0603  BP: (!) 105/58 (!) 115/59 140/81 130/81  Pulse: (!) 52 60 72 61  Resp: '18 19 16 18  ' Temp: 98.2 F (36.8 C) 98.4 F (36.9 C) 97.6 F (36.4 C) 97.6 F (36.4 C)  TempSrc: Oral Oral Oral   SpO2:  100% 98% 98%  Weight:      Height:       General: alert, cooperative and no distress Lochia: appropriate Uterine Fundus: firm Incision: Healing well with no significant drainage DVT Evaluation: No evidence of DVT seen on physical exam. Labs: Lab  Results  Component Value Date   WBC 15.2 (H) 12/13/2020   HGB 9.9 (L) 12/13/2020   HCT 29.4 (L) 12/13/2020   MCV 94.2 12/13/2020   PLT 172 12/13/2020   CMP Latest Ref Rng & Units 10/21/2017  Glucose 65 - 99 mg/dL 97  BUN 6 - 20 mg/dL 11  Creatinine 0.44 - 1.00 mg/dL 0.52  Sodium 135 - 145 mmol/L 134(L)  Potassium 3.5 - 5.1 mmol/L 3.9  Chloride 101 - 111 mmol/L 103  CO2 22 - 32 mmol/L 22  Calcium 8.9 - 10.3 mg/dL 8.4(L)  Total Protein 6.5 - 8.1 g/dL 6.7  Total Bilirubin 0.3 - 1.2 mg/dL 0.6  Alkaline Phos 38 - 126 U/L 271(H)  AST 15 - 41 U/L 22  ALT 14 - 54 U/L 24   Edinburgh Score: Edinburgh Postnatal Depression Scale Screening Tool 12/13/2020  I have been able to laugh and see the funny side of things. 0  I have looked forward with enjoyment to things. 0  I have blamed myself unnecessarily when things went wrong. 1  I have been anxious or worried for no good reason. 2  I have felt scared or panicky for no good reason. 2  Things have been getting on top of me. 0  I have been so unhappy that I have had difficulty sleeping. 0  I have  felt sad or miserable. 0  I have been so unhappy that I have been crying. 0  The thought of harming myself has occurred to me. 0  Edinburgh Postnatal Depression Scale Total 5      After visit meds:  Allergies as of 12/14/2020      Reactions   Penicillins Hives   Has patient had a PCN reaction causing immediate rash, facial/tongue/throat swelling, SOB or lightheadedness with hypotension:  No Has patient had a PCN reaction causing severe rash involving mucus membranes or skin necrosis: No Has patient had a PCN reaction that required hospitalization No Has patient had a PCN reaction occurring within the last 10 years: No If all of the above answers are "NO", then may proceed with Cephalosporin use.      Medication List    TAKE these medications   acetaminophen 500 MG tablet Commonly known as: TYLENOL Take 2 tablets (1,000 mg total) by mouth  every 6 (six) hours.   ibuprofen 600 MG tablet Commonly known as: ADVIL Take 1 tablet (600 mg total) by mouth every 6 (six) hours as needed.   oxyCODONE 5 MG immediate release tablet Commonly known as: Oxy IR/ROXICODONE Take 1 tablet (5 mg total) by mouth every 6 (six) hours as needed for moderate pain.   prenatal multivitamin Tabs tablet Take 1 tablet by mouth in the morning.        Discharge home in stable condition Infant Feeding: Breast Infant Disposition:home with mother Discharge instruction: per After Visit Summary and Postpartum booklet. Activity: Advance as tolerated. Pelvic rest for 6 weeks.  Diet: routine diet Anticipated Birth Control: Unsure Postpartum Appointment:6 weeks Additional Postpartum F/U:  Future Appointments:No future appointments. Follow up Visit:      12/14/2020 Allena Katz, MD

## 2020-12-30 ENCOUNTER — Encounter (HOSPITAL_COMMUNITY): Payer: Self-pay | Admitting: Obstetrics and Gynecology

## 2021-03-17 ENCOUNTER — Encounter (HOSPITAL_COMMUNITY): Payer: Self-pay | Admitting: Obstetrics and Gynecology

## 2022-04-28 ENCOUNTER — Emergency Department (HOSPITAL_BASED_OUTPATIENT_CLINIC_OR_DEPARTMENT_OTHER): Payer: BC Managed Care – PPO

## 2022-04-28 ENCOUNTER — Encounter (HOSPITAL_BASED_OUTPATIENT_CLINIC_OR_DEPARTMENT_OTHER): Payer: Self-pay | Admitting: Emergency Medicine

## 2022-04-28 ENCOUNTER — Emergency Department (HOSPITAL_BASED_OUTPATIENT_CLINIC_OR_DEPARTMENT_OTHER)
Admission: EM | Admit: 2022-04-28 | Discharge: 2022-04-28 | Disposition: A | Discharge status: Home / Self Care | Payer: BC Managed Care – PPO | Attending: Emergency Medicine | Admitting: Emergency Medicine

## 2022-04-28 ENCOUNTER — Other Ambulatory Visit: Payer: Self-pay

## 2022-04-28 DIAGNOSIS — R0602 Shortness of breath: Secondary | ICD-10-CM | POA: Insufficient documentation

## 2022-04-28 DIAGNOSIS — X58XXXA Exposure to other specified factors, initial encounter: Secondary | ICD-10-CM | POA: Insufficient documentation

## 2022-04-28 DIAGNOSIS — D72829 Elevated white blood cell count, unspecified: Secondary | ICD-10-CM | POA: Insufficient documentation

## 2022-04-28 DIAGNOSIS — Z20822 Contact with and (suspected) exposure to covid-19: Secondary | ICD-10-CM | POA: Insufficient documentation

## 2022-04-28 DIAGNOSIS — S29002A Unspecified injury of muscle and tendon of back wall of thorax, initial encounter: Secondary | ICD-10-CM | POA: Diagnosis present

## 2022-04-28 DIAGNOSIS — S29012A Strain of muscle and tendon of back wall of thorax, initial encounter: Secondary | ICD-10-CM | POA: Diagnosis not present

## 2022-04-28 LAB — CBC WITH DIFFERENTIAL/PLATELET
Abs Immature Granulocytes: 0.04 10*3/uL (ref 0.00–0.07)
Basophils Absolute: 0 10*3/uL (ref 0.0–0.1)
Basophils Relative: 0 %
Eosinophils Absolute: 0.3 10*3/uL (ref 0.0–0.5)
Eosinophils Relative: 3 %
HCT: 38.1 % (ref 36.0–46.0)
Hemoglobin: 12.7 g/dL (ref 12.0–15.0)
Immature Granulocytes: 0 %
Lymphocytes Relative: 19 %
Lymphs Abs: 2.3 10*3/uL (ref 0.7–4.0)
MCH: 30.3 pg (ref 26.0–34.0)
MCHC: 33.3 g/dL (ref 30.0–36.0)
MCV: 90.9 fL (ref 80.0–100.0)
Monocytes Absolute: 1 10*3/uL (ref 0.1–1.0)
Monocytes Relative: 8 %
Neutro Abs: 8.2 10*3/uL — ABNORMAL HIGH (ref 1.7–7.7)
Neutrophils Relative %: 70 %
Platelets: 311 10*3/uL (ref 150–400)
RBC: 4.19 MIL/uL (ref 3.87–5.11)
RDW: 11.9 % (ref 11.5–15.5)
WBC: 11.9 10*3/uL — ABNORMAL HIGH (ref 4.0–10.5)
nRBC: 0 % (ref 0.0–0.2)

## 2022-04-28 LAB — RESP PANEL BY RT-PCR (FLU A&B, COVID) ARPGX2
Influenza A by PCR: NEGATIVE
Influenza B by PCR: NEGATIVE
SARS Coronavirus 2 by RT PCR: NEGATIVE

## 2022-04-28 LAB — BASIC METABOLIC PANEL
Anion gap: 6 (ref 5–15)
BUN: 12 mg/dL (ref 6–20)
CO2: 28 mmol/L (ref 22–32)
Calcium: 8.8 mg/dL — ABNORMAL LOW (ref 8.9–10.3)
Chloride: 101 mmol/L (ref 98–111)
Creatinine, Ser: 0.69 mg/dL (ref 0.44–1.00)
GFR, Estimated: >60 mL/min (ref >60)
Glucose, Bld: 96 mg/dL (ref 70–99)
Potassium: 3.5 mmol/L (ref 3.5–5.1)
Sodium: 135 mmol/L (ref 135–145)

## 2022-04-28 LAB — HCG, QUANTITATIVE, PREGNANCY: hCG, Beta Chain, Quant, S: 1 m[IU]/mL (ref <5)

## 2022-04-28 LAB — CBG MONITORING, ED: Glucose-Capillary: 89 mg/dL (ref 70–99)

## 2022-04-28 LAB — BRAIN NATRIURETIC PEPTIDE: B Natriuretic Peptide: 24.7 pg/mL (ref 0.0–100.0)

## 2022-04-28 LAB — D-DIMER, QUANTITATIVE: D-Dimer, Quant: 0.45 ug/mL-FEU (ref 0.00–0.50)

## 2022-04-28 MED ORDER — METHOCARBAMOL 500 MG PO TABS: 500.0000 mg | ORAL_TABLET | Freq: Two times a day (BID) | ORAL | 0 refills | Status: AC

## 2022-04-28 MED ORDER — LIDOCAINE 5 % EX PTCH
1.0000 | MEDICATED_PATCH | Freq: Once | CUTANEOUS | Status: DC
  Administered 2022-04-28: 1 via TRANSDERMAL
  Filled 2022-04-28: qty 1

## 2022-04-28 MED ORDER — METHOCARBAMOL 500 MG PO TABS
500.0000 mg | ORAL_TABLET | Freq: Once | ORAL | Status: AC
  Administered 2022-04-28: 500 mg via ORAL
  Filled 2022-04-28: qty 1

## 2022-04-28 MED ORDER — SODIUM CHLORIDE 0.9 % IV BOLUS
1000.0000 mL | Freq: Once | INTRAVENOUS | Status: AC
  Administered 2022-04-28: 1000 mL via INTRAVENOUS

## 2022-04-28 MED ORDER — LIDOCAINE 5 % EX PTCH: 1.0000 | MEDICATED_PATCH | CUTANEOUS | 0 refills | Status: AC

## 2022-04-28 NOTE — Discharge Instructions (Signed)
It was a pleasure taking care of you today!   Your COVID and flu swabs are negative today.  Your chest x-ray did not show any concerning findings at this time.  Your labs are overall unremarkable, you did have a slightly elevated white count.  You will be sent a prescription for Robaxin and Lidoderm patch, use as directed.  Do not drive or operate heavy machinery while taking the Robaxin as it can make you sleepy and drowsy.  You may take over-the-counter 500 mg Tylenol every 6 hours and alternate with 600 mg ibuprofen every 6 hours as needed for pain.  Ensure to place a warm compress to the affected area for up to 15 minutes at a time, ensure to place a barrier between your skin and the warm compress.  It is important that you take at least 10 deep breaths every hour.  Return to the emergency department experience increasing/worsening chest pain, trouble breathing, worsening symptoms.

## 2022-04-28 NOTE — ED Provider Notes (Signed)
[facility] EMERGENCY DEPARTMENT Provider Note   CSN: [id number] Arrival date & time: 04/28/22  1706     History  Chief Complaint  Patient presents with   Shortness of Breath    Jennifer Brooks is a 31 y.o. female who presents to the ED complaining of shortness of breath onset today. Pt notes that her symptoms started due to picking up her 34.93-year-old son today and feeling like she pulled a muscle to her right upper back.  Has tried ibuprofen and oxycodone for his symptoms prior to arrival.  No recent injury, trauma, fall. Denies chest pain, nausea, vomiting, numbness, tingling, weakness, rhinorrhea, nasal congestion, sore throat.  Denies sick contacts. Denies anticoagulant use, recent immobilization, recent surgery, history of malignancy, history of DVT/PE, OCP.  The history is provided by the patient. No language interpreter was used.       Home Medications Prior to Admission medications   Medication Sig Start Date End Date Taking? Authorizing Provider  lidocaine (LIDODERM) 5 % Place 1 patch onto the skin daily. Remove & Discard patch within 12 hours or as directed by MD 04/28/22  Yes Timia Casselman A, PA-C  methocarbamol (ROBAXIN) 500 MG tablet Take 1 tablet (500 mg total) by mouth 2 (two) times daily. 04/28/22  Yes Kashius Dominic A, PA-C  acetaminophen (TYLENOL) 500 MG tablet Take 2 tablets (1,000 mg total) by mouth every 6 (six) hours. 12/14/20   Harold Hedge, MD  ibuprofen (ADVIL) 600 MG tablet Take 1 tablet (600 mg total) by mouth every 6 (six) hours as needed. 12/14/20   Harold Hedge, MD  oxyCODONE (OXY IR/ROXICODONE) 5 MG immediate release tablet Take 1 tablet (5 mg total) by mouth every 6 (six) hours as needed for moderate pain. 12/14/20   Harold Hedge, MD  Prenatal Vit-Fe Fumarate-FA (PRENATAL MULTIVITAMIN) TABS tablet Take 1 tablet by mouth in the morning.    [provider]      Allergies    Penicillins    Review of Systems   Review of Systems  HENT:   Negative for congestion, rhinorrhea and sore throat.   Respiratory:  Positive for shortness of breath.   Cardiovascular:  Negative for chest pain.  Gastrointestinal:  Negative for nausea and vomiting.  Neurological:  Negative for weakness and numbness.       -tingling  All other systems reviewed and are negative.   Physical Exam Updated Vital Signs BP (!) 184/127 (BP Location: Left Arm) Comment: Taken x3  Pulse (!) 125   Temp 98.3 F (36.8 C) (Oral)   Resp (!) 22   Ht 5\' 3"  (1.6 m)   Wt 63.5 kg   LMP 04/11/2022 Comment: iud  SpO2 100%   Breastfeeding No   BMI 24.80 kg/m  Physical Exam Vitals and nursing note reviewed.  Constitutional:      General: She is not in acute distress.    Appearance: She is not diaphoretic.  HENT:     Head: Normocephalic and atraumatic.     Mouth/Throat:     Pharynx: No oropharyngeal exudate.  Eyes:     General: No scleral icterus.    Conjunctiva/sclera: Conjunctivae normal.  Cardiovascular:     Rate and Rhythm: Normal rate and regular rhythm.     Pulses: Normal pulses.     Heart sounds: Normal heart sounds.  Pulmonary:     Effort: Pulmonary effort is normal. No respiratory distress.     Breath sounds: Normal breath sounds. No wheezing.  Chest:  Chest wall: No tenderness.     Comments: No chest wall TTP. No TTP noted to right lateral/posterior ribs.  Abdominal:     General: Bowel sounds are normal.     Palpations: Abdomen is soft. There is no mass.     Tenderness: There is no abdominal tenderness. There is no guarding or rebound.  Musculoskeletal:        General: Normal range of motion.     Cervical back: Normal range of motion and neck supple.     Comments: No C, T, L, S spinal TTP.  Mild tenderness to palpation noted to right upper back without overlying skin changes.  Skin:    General: Skin is warm and dry.  Neurological:     Mental Status: She is alert.  Psychiatric:        Behavior: Behavior normal.     ED Results /  Procedures / Treatments   Labs (all labs ordered are listed, but only abnormal results are displayed) Labs Reviewed  BASIC METABOLIC PANEL - Abnormal; Notable for the following components:      Result Value   Calcium 8.8 (*)    All other components within normal limits  CBC WITH DIFFERENTIAL/PLATELET - Abnormal; Notable for the following components:   WBC 11.9 (*)    Neutro Abs 8.2 (*)    All other components within normal limits  RESP PANEL BY RT-PCR (FLU A&B, COVID) ARPGX2  BRAIN NATRIURETIC PEPTIDE  HCG, QUANTITATIVE, PREGNANCY  D-DIMER, QUANTITATIVE  CBG MONITORING, ED    EKG None  Radiology DG Chest Portable 1 View  Result Date: 04/28/2022 CLINICAL DATA:  Right back pain beginning this morning after picking up her son. EXAM: PORTABLE CHEST 1 VIEW COMPARISON:  None Available. FINDINGS: Normal heart, mediastinum and hila. Clear lungs.  No pleural effusion or pneumothorax. Skeletal structures are unremarkable. IMPRESSION: No active disease. Electronically Signed   By: Lajean Manes M.D.   On: 04/28/2022 18:11    Procedures Procedures    Medications Ordered in ED Medications  lidocaine (LIDODERM) 5 % 1 patch (1 patch Transdermal Patch Applied 04/28/22 1846)  sodium chloride 0.9 % bolus 1,000 mL (0 mLs Intravenous Stopped 04/28/22 1929)  methocarbamol (ROBAXIN) tablet 500 mg (500 mg Oral Given 04/28/22 1921)    ED Course/ Medical Decision Making/ A&P Clinical Course as of 04/28/22 2033  Sat Apr 28, 2022  2010 Re-evaluated and noted improved symptoms of treatment regimen in the ED. discussed with patient lab and imaging findings.  Discussed with patient discharge treatment plan.  Answered all available questions.  Patient appears safe for discharge at this time. [SB]    Clinical Course User Index [SB] Dwana Garin A, PA-C                           Medical Decision Making Amount and/or Complexity of Data Reviewed Labs: ordered. Radiology: ordered.  Risk Prescription  drug management.   Patient presents to the ED complaining of shortness of breath onset today.  Vital signs, patient initially tachycardic, afebrile, not hypoxic.  On exam patient with no chest wall tenderness to palpation.  No tenderness to palpation noted to right posterior/lateral ribs.  No spinal tenderness to palpation.  Mild tenderness to palpation noted to right upper musculature of back. No acute cardiovascular, respiratory exam findings. No pitting edema noted bilaterally.  No recent surgery/immobilization, OCP, history of malignancy, history of DVT/PE, anticoagulant use.  Differential diagnosis includes CHF, PTX,  pneumonia, PE, muscle spasm/strain.    Additional history obtained:  Additional history obtained from Spouse/Significant Other  Labs:  I ordered, and personally interpreted labs.  The pertinent results include:   COVID and flu swab negative. CBG at 89 unremarkable. BMP unremarkable. BNP at 24.7 and unremarkable. D-dimer negative. hCG quantitative negative. CBC with slight leukocytosis otherwise unremarkable  Imaging: I ordered imaging studies including chest x-ray I independently visualized and interpreted imaging which showed: No acute cardiopulmonary findings I agree with the radiologist interpretation  Medications:  I ordered medication including IV fluids, Robaxin, Lidoderm patch, warm compress for the management Reevaluation of the patient after these medicines and interventions, I reevaluated the patient and found that they have improved I have reviewed the patients home medicines and have made adjustments as needed  Disposition: Presentation suspicious for muscle strain of right upper back as cause of shortness of breath.  Doubt CHF, pneumothorax, pneumonia at this time.  Doubt PE at this time, dimer negative, tachycardia, shortness of breath and symptoms improved with treatment regimen in the ED.  After consideration of the diagnostic results and the patients  response to treatment, I feel that the patient would benefit from Discharge home.  Patient will be discharged home with a prescription for Lidoderm patch and Robaxin.  Supportive care measures and strict return precautions discussed with patient at bedside. Pt acknowledges and verbalizes understanding. Pt appears safe for discharge. Follow up as indicated in discharge paperwork.    This chart was dictated using voice recognition software, Dragon. Despite the best efforts of this provider to proofread and correct errors, errors may still occur which can change documentation meaning.   Final Clinical Impression(s) / ED Diagnoses Final diagnoses:  Shortness of breath  Muscle strain of right upper back, initial encounter    Rx / DC Orders ED Discharge Orders          Ordered    lidocaine (LIDODERM) 5 %  Every 24 hours        04/28/22 2012    methocarbamol (ROBAXIN) 500 MG tablet  2 times daily        04/28/22 [address], Georgean Spainhower A, PA-C 04/28/22 2033    Vanetta Mulders, MD 04/29/22 1846

## 2022-04-28 NOTE — ED Triage Notes (Signed)
Pt c/o "pulling a muscle" now hard to take deep breath. C/o right back pain starting this AM after picking up son. Denies parathesia. Hypertensive at triage, denies CP. Pt speaking in full sentences on RA.   Took 600 mg ibuprofen, no relief. 1600 took oxycodone, no relief.

## 2022-04-28 NOTE — ED Notes (Signed)
States that she has had pain since 830. States that she feel shortness of breath oxygen is 100 on room air. Denies any chest pain. Denies any numbeness of tingling in her exteremties
